# Patient Record
Sex: Female | Born: 1945
Health system: Southern US, Community
[De-identification: ages and names within clinical notes are randomized; demographics above are authoritative.]

## PROBLEM LIST (undated history)

## (undated) DIAGNOSIS — J45909 Unspecified asthma, uncomplicated: Secondary | ICD-10-CM

## (undated) DIAGNOSIS — K219 Gastro-esophageal reflux disease without esophagitis: Secondary | ICD-10-CM

## (undated) DIAGNOSIS — M810 Age-related osteoporosis without current pathological fracture: Secondary | ICD-10-CM

## (undated) DIAGNOSIS — E785 Hyperlipidemia, unspecified: Secondary | ICD-10-CM

## (undated) DIAGNOSIS — I10 Essential (primary) hypertension: Secondary | ICD-10-CM

## (undated) DIAGNOSIS — T7840XA Allergy, unspecified, initial encounter: Secondary | ICD-10-CM

## (undated) DIAGNOSIS — E119 Type 2 diabetes mellitus without complications: Secondary | ICD-10-CM

## (undated) HISTORY — DX: Hyperlipidemia, unspecified: E78.5

## (undated) HISTORY — DX: Unspecified asthma, uncomplicated: J45.909

## (undated) HISTORY — DX: Type 2 diabetes mellitus without complications: E11.9

## (undated) HISTORY — DX: Allergy, unspecified, initial encounter: T78.40XA

## (undated) HISTORY — DX: Essential (primary) hypertension: I10

## (undated) HISTORY — PX: JOINT REPLACEMENT: SHX530

## (undated) HISTORY — DX: Gastro-esophageal reflux disease without esophagitis: K21.9

## (undated) HISTORY — PX: BREAST SURGERY: SHX581

## (undated) HISTORY — DX: Age-related osteoporosis without current pathological fracture: M81.0

---

## 1997-08-09 ENCOUNTER — Inpatient Hospital Stay (HOSPITAL_COMMUNITY): Admission: EM | Admit: 1997-08-09 | Discharge: 1997-08-11 | Payer: Self-pay | Admitting: Cardiology

## 1998-09-27 ENCOUNTER — Inpatient Hospital Stay (HOSPITAL_COMMUNITY)
Admission: RE | Admit: 1998-09-27 | Discharge: 1998-10-09 | Payer: Self-pay | Admitting: Physical Medicine & Rehabilitation

## 1999-11-17 ENCOUNTER — Encounter: Admission: RE | Admit: 1999-11-17 | Discharge: 2000-02-15 | Payer: Self-pay | Admitting: Radiation Oncology

## 2000-08-13 ENCOUNTER — Ambulatory Visit (HOSPITAL_COMMUNITY): Admission: RE | Admit: 2000-08-13 | Discharge: 2000-08-13 | Payer: Self-pay | Admitting: Oncology

## 2001-02-16 ENCOUNTER — Encounter: Payer: Self-pay | Admitting: Oncology

## 2001-02-16 ENCOUNTER — Ambulatory Visit (HOSPITAL_COMMUNITY): Admission: RE | Admit: 2001-02-16 | Discharge: 2001-02-16 | Payer: Self-pay | Admitting: Oncology

## 2005-07-21 ENCOUNTER — Ambulatory Visit: Payer: Self-pay | Admitting: Cardiology

## 2005-07-23 ENCOUNTER — Observation Stay (HOSPITAL_COMMUNITY): Admission: AD | Admit: 2005-07-23 | Discharge: 2005-07-24 | Payer: Self-pay | Admitting: Internal Medicine

## 2005-07-23 ENCOUNTER — Ambulatory Visit: Payer: Self-pay | Admitting: Cardiology

## 2005-08-04 ENCOUNTER — Ambulatory Visit: Payer: Self-pay | Admitting: Cardiology

## 2005-08-14 ENCOUNTER — Ambulatory Visit: Payer: Self-pay | Admitting: Cardiology

## 2005-09-01 ENCOUNTER — Ambulatory Visit: Payer: Self-pay | Admitting: Cardiology

## 2005-10-06 ENCOUNTER — Ambulatory Visit: Payer: Self-pay | Admitting: Cardiology

## 2007-12-13 ENCOUNTER — Ambulatory Visit: Payer: Self-pay | Admitting: Cardiology

## 2008-01-16 ENCOUNTER — Ambulatory Visit: Payer: Self-pay | Admitting: Cardiology

## 2008-01-24 ENCOUNTER — Ambulatory Visit: Payer: Self-pay | Admitting: Cardiology

## 2008-02-06 ENCOUNTER — Ambulatory Visit: Payer: Self-pay | Admitting: Cardiology

## 2010-09-02 NOTE — Assessment & Plan Note (Signed)
[facility]                          [facility]   NAME:[name], [name] ECCLESTON                      MRN:          [id number]  DATE:01/16/2008                            DOB:          1945-07-29    [name] [name] is a very pleasant 65 year old female who I am asked to  evaluate for an abnormal nuclear study and chest tightness.  The patient  has had previous cardiac catheterizations, both in 1999 and 2007.  Both  of these were normal.  The most recent cardiac catheterization was  performed on July 23, 2005.  Cardiolite had been performed, at that  time, that suggested ischemia.  The catheterization revealed no  obstructive coronary disease.  Ejection fraction was 65%.  The left  ventricular end-diastolic pressure was 23 mmHg.  Note, the patient also  had an echocardiogram performed last on August 14, 2005.  She had normal  LV function.  There was mild mitral and tricuspid regurgitation.  There  was moderate pulmonary hypertension with a pulmonary artery systolic  pressure estimated at 50 mmHg.  The patient states that for the past 3  months, she has resumed an exercise program.  She is on an Sport and exercise psychologist for approximately 30 minutes of time and also performs some  weightlifting.  She was there recently and was told that she was having  an arrhythmia after they checked her saturations.  She went to Dr.  Reuel Boom and Dr. Reuel Boom ordered a Myoview.  The nuclear study was  performed on December 13, 2007.  It was interpreted by Dr. Diona Browner and  the conclusion suggested there was a moderate inner apical defect, more  prominent at rest and consistent with soft tissue attenuation rather  than ischemia.  The ejection fraction was 42%, but visually appeared  better.  We were asked to see the patient for this nuclear study.  Note,  the patient does rarely have chest tightness, but also has asthma.  Her  chest tightness increases with inspiration.  She has  mild dyspnea with  more extreme activities, but not with routine activities.  There is no  orthopnea, PND, pedal edema, palpitations, presyncope, or syncope.  She  does not have exertional chest pain.   MEDICATIONS:  1. Aspirin 81 mg p.o. daily.  2. Ventolin inhaler.  3. Symbicort.  4. Fluticasone.  5. Astelin.  6. Loratadine.  7. Amlodipine 10 mg daily.  8. Diovan HCT 160/25 mg tablets 1 p.o. daily.  9. Potassium 2 mEq p.o. b.i.d.  10.Etodolac.  11.Lidoderm patch.  12.Omeprazole.  13.Singulair.  14.Ocuvite.  15.Vitamin C.  16.Vitamin E.  17.Calcium.  18.Multivitamin.   ALLERGIES:  She has an allergy to PENICILLIN, CODEINE, SULFA, FELDENE,  NAPROSYN, ERYTHROMYCIN, and DOXYCYCLINE.  She also has an allergy to  latex.   SOCIAL HISTORY:  She does not smoke at present and it has been 20 years  since she smoked previously.  She does not consume alcohol.   FAMILY HISTORY:  Negative for coronary artery disease at an early age.  She did state her father had coronary disease in his  late 53s.   PAST MEDICAL HISTORY:  Significant for borderline diabetes as well as  hypertension.  She denies any hyperlipidemia.  She does have a history  of asthma and obstructive sleep apnea and uses CPAP.  She has had  previous cancer of the right breast and is status post lumpectomy as  well as chemotherapy and radiation therapy.  She has had a previous  diverticulitis.  She has had 2 previous knee replacements.  She has had  a previous tonsillectomy.  There is no other past medical history noted.   REVIEW OF SYSTEMS:  She denies any headaches, fevers, or chills.  There  is no productive cough or hemoptysis.  There is no dysphagia,  odynophagia, melena, or hematochezia.  There is no dysuria or hematuria.  There is no rash or seizure activity.  There is no orthopnea, PND, or  pedal edema.  Remaining systems are negative.   PHYSICAL EXAMINATION:  VITAL SIGNS:  Today, shows a blood pressure of   128/60 and pulse of 74.  She weighs 347 pounds.  GENERAL:  She is well-developed and morbidly obese.  She is in no acute  distress at present.  Her skin is warm and dry.  She is not  predepressed.  There is no peripheral clubbing.  BACK:  Normal.  HEENT:  Normal eyelids.  NECK:  Supple with a normal upstroke bilaterally and I cannot appreciate  bruits.  There is no jugular venous distention and no thyromegaly.  CHEST:  Clear to auscultation.  Normal expansion.  CARDIOVASCULAR:  Reveals a regular rate and rhythm with a normal S1 and  S2.  There is a 2/6 systolic ejection murmur in the left sternal border.  I cannot appreciate S3 or S4.  ABDOMEN:  Shows no tenderness to palpation.  I cannot appreciate  hepatosplenomegaly.  There are no masses palpated, although exam is  difficult due to her obesity.  EXTREMITIES:  Her femoral pulses are difficult to palpate due to her  obesity.  There are no bruits noted.  Her extremities show no edema that  I could  palpate.  No cords.  She has 2+ posterior tibial pulses  bilaterally.  NEUROLOGIC:  Grossly intact.   I do have an electrocardiogram from December 13, 2007, that showed a sinus  rhythm with no ST changes.   DIAGNOSES:  1. Abnormal nuclear study - the nuclear study is interpreted as soft      tissue attenuation, but no ischemia.  Ejection fraction is reduced,      but visually appeared better.  We will plan to proceed with an      echocardiogram to quantify left ventricular function more fully and      also to evaluate the murmur.  This is particularly in light of her      history of breast cancer and chemotherapy use.  If her      echocardiogram is normal, then she does not require further cardiac      workup.  It should be noted that she had a catheterization in 2007      that showed normal coronary arteries.  2. Hypertension - her blood pressure is adequately controlled on her      present medications.  3. Borderline diabetes  mellitus.  4. History of asthma.  5. Obesity - I encouraged the patient to continue with her exercises      and weight loss.   I will have her return to see one of  our physician's assistants in  approximately 2-4 weeks to review her echocardiogram.  Again, I do not  think she will require further cardiac workup if her echocardiogram is  normal.     Madolyn Frieze. Jens Som, MD, [facility]  Electronically Signed    BSC/MedQ  DD: 01/16/2008  DT: 01/17/2008  Job #: [id number]   cc:   Donzetta Sprung

## 2010-09-02 NOTE — Assessment & Plan Note (Signed)
[facility]                          [facility] NOTE   NAME:[name] [name], [name] [name]                      MRN:          [id number]  DATE:02/06/2008                            DOB:          12/07/1945    PRIMARY CARDIOLOGIST:  Jonelle Sidle, MD.   REASON FOR VISIT:  Scheduled followup.  Please refer to Dr. Lewayne Bunting recent office consultation note of January 16, 2008, for  full details.   At that time, Ms. Elmendorf was referred for evaluation of her recent  abnormal stress test, ordered by Dr. Reuel Boom.  This was apparently for  further evaluation of possible arrhythmia, which the patient  reportedly developed during one of her scheduled [place].  The stress test was reviewed by Dr. Diona Browner.  There was question of a possible moderate anterior apical defect, but  most likely was related to soft tissue attenuation.  Ejection fraction  was calculated at 42%, and there was borderline increase in TID ratio.   Dr. Jens Som reviewed all of this and recommended a followup 2-D  echocardiogram.  He felt that if this were normal, given that she had  had a normal cardiac catheterization in 2007, then no further workup  would be indicated.   The initial echocardiogram, also reviewed by Dr. Diona Browner, showed normal  LVF (55-60%), but with evidence of diastolic dysfunction.  There was  also a question of an apparent small PFO with predominant left-right  shunting.  A followup bubble study or TEE was recommended.  There was  also mild mitral regurgitation.   Given this finding, we ordered a bubble study, just recently completed  and reviewed by Dr. Andee Lineman.  This study, however, showed no evidence of  either a PFO or intrapulmonary shunting.  There was also no evidence of  mitral regurgitation.   All of these results were reviewed with the patient today, in full.  Clinically, her history is suggestive of  atypical chest pain; this is  unpredictable in onset and has no strict correlation with any specific  activity or exertion.  She does, however, have chronic exertional  dyspnea.  However, she also has morbid obesity with a current weight of  nearly 350 pounds.   CURRENT MEDICATIONS:  As previously delineated, during most recent  visit.   PHYSICAL EXAMINATION:  VITAL SIGNS:  Blood pressure 125/73; pulse 70,  regular; and weight 340.6.  GENERAL:  A 65 year old female, morbidly obese, sitting upright, no  distress.  HEENT:  Normocephalic and atraumatic.  NECK:  Palpable.  Carotid pulses without bruits; unable to assess JVD,  secondary to neck girth.  LUNGS:  Clear to auscultation in all fields.  HEART:  Regular rate and rhythm.  No significant murmurs.  ABDOMEN:  Protuberant, intact bowel sounds.  EXTREMITIES:  Minimally palpable dorsalis pedis pulses with trace pedal  edema.  NEURO:  No focal deficit.   IMPRESSION:  1. Atypical chest pain.      a.     Normal coronary angiogram, April 2007.      b.  Normal LVF by recent echocardiogram.  2. Chronic exertional dyspnea.  3. Hypertension.  4. Borderline diabetes mellitus.  5. History of asthma.  6. Morbid obesity.  7. History of palpitations.      a.     Negative event monitor in 2007.  8. History of obstructive sleep apnea.   PLAN:  Following extensive review of the patient's previous diagnostic  tests, including cardiac catheterization in 2007, with Dr. Simona Huh,  no further formal diagnostic testing is suggested at this point in time.  The patient presents with symptoms which are not clearly suggestive of  ischemic heart disease.  Given the relatively recent normal angiogram,  and recent normal echocardiograms, we do not feel that any additional  testing is clearly indicated at this point in time.  She does, however,  need to continue close followup with her primary physician, Dr. Donzetta Sprung, for continued monitoring  and management of her hypertension,  borderline diabetes, and morbid obesity.   Ms. Devargas is also strongly urged to resume her previous exercise  [place] the mall.  I also urged her to try to focus on concomitant  weight loss.   We will be happy to have Ms. Hoak return to our clinic to follow up  with Dr. Simona Huh on an as-needed basis.      Rozell Searing, PA-C  Electronically Signed      Jonelle Sidle, MD  Electronically Signed   GS/MedQ  DD: 02/06/2008  DT: 02/07/2008  Job #: [id number]   cc:   Donzetta Sprung

## 2010-09-05 NOTE — Discharge Summary (Signed)
[name], [name]               ACCOUNT NO.:  [id number]   MEDICAL RECORD NO.:  [id number]          PATIENT TYPE:  INP   LOCATION:  4731                         FACILITY:  [facility]   PHYSICIAN:  Joellyn Rued, P.A. [facility] DATE OF BIRTH:  1945-09-26   DATE OF ADMISSION:  07/23/2005  DATE OF DISCHARGE:  07/24/2005                           DISCHARGE SUMMARY - REFERRING   DISCHARGE DIAGNOSIS:  Noncardiac chest discomfort, status post cardiac  catheterization, which did not show any coronary artery disease.  False-  positive Adenosine-Myoview may be related to her obesity.  History as noted.   PROCEDURES PERFORMED:  Cardiac catheterization on July 22, 2005, by Dr.  Diona Browner.   SUMMARY OF HISTORY:  [name] is a 65 year old, African-American female,  who presented to [facility] on July 21, 2005, with sharp chest  pressure that began around midnight radiating to the left side into her  back.  Symptoms would last two to three minutes and, after several episodes  with sharp jabbing discomfort, she was referred to the ER by her primary  care physician.  The discomfort can occur intermittently with at rest and  with exertion and has been going on for several weeks.  She also describes  headaches, occasional dyspnea on exertion, diaphoresis.  Prior cardiac  catheterization in 1999 did not show any significant coronary disease.   PAST MEDICAL HISTORY:  1.  Morbid obesity.  2.  Diabetes.  3.  Hypertension.  4.  Dyslipidemia.  5.  Right breast adenocarcinoma status post chemotherapy.  6.  DVT.  7.  Bilateral carpal tunnel syndrome.  8.  Obstructive sleep apnea.  9.  Neuropathy.  10. Chronic low back pain.  11. Colitis.  12. Asthma.   LABORATORY DATA:  At [facility], admission weight was 328.2.  Post  catheterization, H&H were 11.8 and 36.2, normal indices, platelets 362, WBC  7.7.  Sodium 137, potassium 3.5, BUN 8, creatinine 0.8.   HOSPITAL COURSE:  [name] [name] was transferred  to [facility] after  she underwent Adenosine-Myoview at [facility].  This showed an EF of  60% and anteroseptal and inferior partial reversible defects, which could  represent ischemia.  It was felt that she should undergo cardiac  catheterization.   On July 23, 2005, she underwent cardiac catheterization by Dr. Diona Browner.  She did not have any coronary artery disease, the EF was 65%.  Post sheath  removal and bedrest, the patient was ambulating without difficulty.  Findings were discussed with Dr. Andee Lineman, and it was felt that her discomfort  was not cardiac related.  Given her multiple risk factors, she was kept  overnight for further observation, the catheterization site was stable.  On  July 24, 2005, she was ambulating without difficulty and it was felt that  she could be discharged home.   DISPOSITION ON DISCHARGE:  She was asked to maintain low salt, fat,  cholesterol, ADA diet.  Her activity and wound care per supplemental  discharge instruction sheet post catheterization.  She was asked to bring  all medicines to all appointments.  Her medications remained essentially  unchanged from prior to admission:  1.  Albuterol 2 puffs q.4h.  2.  Advair 500/50 1 puff b.i.d.  3.  DuoNeb 4 times a day.  4.  Nasonex as previously.  5.  Astelin as previously.  6.  Singulair 10 mg daily.  7.  Loratadine 10 mg daily.  8.  Norvasc 10 mg daily.  9.  Potassium 20 mEq daily.  10. HCTZ 25 mg daily.  11. Lasix 40 mg daily.  12. Lodine daily.  13. Nexium 40 mg daily.  14. Arimidex 1.0 mg daily.  15. Multivitamin daily with Vitamin C, E, calcium, and zinc as previously.  16. Tramadol 50 mg p.r.n.  17. Senna-C 2 tabs daily.  18. Baby aspirin 81 mg daily.   On Monday, she was asked to call Dr. Margarita Mail office and arrange a groin  check with Arnette Felts for next week and to also call Dr. Reuel Boom at his  office to arrange one to two week followup.      Joellyn Rued, P.A.  [facility]     EW/MEDQ  D:  07/24/2005  T:  07/24/2005  Job:  [id number]   cc:   Donzetta Sprung  Fax: [phone]   Learta Codding, M.D. [facility]  [address]  [address]  [place]  [place] [address]

## 2010-09-05 NOTE — Cardiovascular Report (Signed)
NAMEDEEANNA, BEIGHTOL NO.:  [id number]   MEDICAL RECORD NO.:  [id number]          PATIENT TYPE:  INP   LOCATION:  4731                         FACILITY:  [facility]   PHYSICIAN:  Jonelle Sidle, M.D. LHCDATE OF BIRTH:  1945/05/02   DATE OF PROCEDURE:  07/23/2005  DATE OF DISCHARGE:                              CARDIAC CATHETERIZATION   REQUESTING CARDIOLOGIST:  Dr. Lewayne Bunting   PRIMARY CARE PHYSICIAN:  Dr. Fara Chute   INDICATION:  Ms. Gordon is a 65 year old morbidly obese woman with type 2  diabetes mellitus, hypertension, and hyperlipidemia.  She was recently  admitted to [facility] with a description of sharp,  intermittent chest discomfort and shortness of breath.  She underwent an  adenosine Cardiolite study which was electrocardiographically non-diagnostic  of ischemia and revealed anteroseptal and inferior partially reversible  defects.  It was felt that this could have been artifactual; however,  ischemia could not be clearly excluded.  The ejection fraction was noted to  be 60%.  She is now referred for a diagnostic coronary angiography including  a right heart catheterization for the assessment of potential pulmonary  hypertension and definition of the coronary anatomy.  The risks and benefits  of the procedure were explained to the patient in advance and informed  consent was obtained.   PROCEDURE PERFORMED:  1.  Left heart catheterization.  2.  Right heart catheterization.  3.  Selective coronary angiography.  4.  Left ventriculography.   ACCESS AND EQUIPMENT:  The area about the right femoral artery and vein was  anesthetized with 1% lidocaine.  A 6-French sheath was placed in the right  femoral artery via modified the Seldinger technique followed by a 7-French  sheath in the right femoral vein via the modified Seldinger technique.  Standard preformed 6-French JL4 and JR4 catheters were used for selective  coronary angiography  and an angled pigtail catheter was used for left heart  catheterization and left ventriculography.  A standard 7-French balloon-  tipped flow-directed catheter was used for right heart catheterization.  All  exchanges were made over a wire with the exception of the right heart  catheter.  A total of 110 mL of Omnipaque were used.  The patient tolerated  the procedure well without immediate complications.  She did complain of  back discomfort while lying on the catheterization table during the  procedure.  She received a total of 4 mg Versed, 4 mg morphine, and 50 mcg  fentanyl during the procedure.   HEMODYNAMIC RESULTS:  Right atrial mean of 17.  Right ventricle 37/17.  Pulmonary artery 33/21 with a mean of 27.  Pulmonary capillary wedge  pressure mean of 24.  Left ventricle 163/23.  Aorta 162/87.  Cardiac output  5.6 by the Fick method.  Cardiac index 2.3 by the Fick method.  Arterial  saturation is 93% on room air.  Pulmonary artery saturation is 69% on room  air.   ANGIOGRAPHIC FINDINGS:  1.  There is a very short left main coronary artery with essentially nearly      separate ostia of  the left anterior descending and circumflex coronary      arteries.  2.  The left anterior descending is a fairly large-caliber vessel that      extends to the apex where it bifurcates.  There are three diagonal      branches the first of which bifurcates.  There are minor luminal      irregularities, although no flow-limiting stenoses are noted.  No      significant bridging is noted.  3.  The circumflex coronary artery is medium in caliber.  There are two      obtuse marginal branches noted.  No significant flow-limiting coronary      atherosclerosis is noted.  4.  The right coronary artery is a large, dominant vessel with a large      posterior descending branch and three small right ventricular marginal      branches.  There is also a fairly well-developed posterolateral system.      There are  minor luminal irregularities without flow-limiting stenoses.   Left ventriculography was performed in the RAO projection, revealed ejection  fraction of approximately 65% without wall motion abnormalities and no  significant mitral regurgitation.   DIAGNOSES:  1.  No obstructive coronary artery disease noted within the major epicardial      vessels.  2.  Left ventricular ejection fraction approximately 65% with no focal wall      motion abnormalities and left ventricle end-diastolic pressure of 23      mmHg.  3.  Essentially upper normal pulmonary artery systolic pressure measured at      33, pulmonary capillary wedge pressure mean of 24, cardiac output of      5.6, and cardiac index of 2.3.  Arterial saturation is 93% on room air.   DISCUSSION:  I reviewed the results with the patient and with Dr. Andee Lineman by  phone.  Chest pain would seem to be most likely non-cardiac in etiology and  this study would suggest that the potential perfusion abnormalities noted on  Cardiolite study were artifactual.  Would anticipate risk factor  modification at this point.  The patient will be observed overnight with  plans for Dr. Andee Lineman to follow up with her.           ______________________________  Jonelle Sidle, M.D. [facility]     SGM/MEDQ  D:  07/23/2005  T:  07/24/2005  Job:  [id number]   cc:   Learta Codding, M.D. [facility]  [address]  [address]  [place]  [place] [address]   Fara Chute  Fax: [phone]

## 2012-01-04 ENCOUNTER — Encounter: Payer: Self-pay | Admitting: Hematology and Oncology

## 2012-01-04 DIAGNOSIS — J4489 Other specified chronic obstructive pulmonary disease: Secondary | ICD-10-CM

## 2012-01-04 DIAGNOSIS — Z853 Personal history of malignant neoplasm of breast: Secondary | ICD-10-CM

## 2012-01-04 DIAGNOSIS — R920 Mammographic microcalcification found on diagnostic imaging of breast: Secondary | ICD-10-CM

## 2012-01-04 DIAGNOSIS — I10 Essential (primary) hypertension: Secondary | ICD-10-CM

## 2012-01-04 DIAGNOSIS — J449 Chronic obstructive pulmonary disease, unspecified: Secondary | ICD-10-CM

## 2012-01-13 ENCOUNTER — Encounter: Payer: Self-pay | Admitting: Hematology and Oncology

## 2012-01-13 DIAGNOSIS — J449 Chronic obstructive pulmonary disease, unspecified: Secondary | ICD-10-CM

## 2012-01-13 DIAGNOSIS — J209 Acute bronchitis, unspecified: Secondary | ICD-10-CM

## 2014-09-06 ENCOUNTER — Other Ambulatory Visit (HOSPITAL_COMMUNITY): Payer: Self-pay | Admitting: Family Medicine

## 2014-09-06 ENCOUNTER — Ambulatory Visit (HOSPITAL_COMMUNITY)
Admission: RE | Admit: 2014-09-06 | Discharge: 2014-09-06 | Disposition: A | Discharge status: Home / Self Care | Payer: Medicare Other | Source: Ambulatory Visit | Attending: Family Medicine | Admitting: Family Medicine

## 2014-09-06 DIAGNOSIS — M79662 Pain in left lower leg: Secondary | ICD-10-CM

## 2014-09-06 DIAGNOSIS — M7989 Other specified soft tissue disorders: Secondary | ICD-10-CM | POA: Diagnosis not present

## 2015-04-24 DIAGNOSIS — G4733 Obstructive sleep apnea (adult) (pediatric): Secondary | ICD-10-CM | POA: Diagnosis not present

## 2015-04-24 DIAGNOSIS — E782 Mixed hyperlipidemia: Secondary | ICD-10-CM | POA: Diagnosis not present

## 2015-04-24 DIAGNOSIS — J301 Allergic rhinitis due to pollen: Secondary | ICD-10-CM | POA: Diagnosis not present

## 2015-04-24 DIAGNOSIS — R69 Illness, unspecified: Secondary | ICD-10-CM | POA: Diagnosis not present

## 2015-04-24 DIAGNOSIS — I10 Essential (primary) hypertension: Secondary | ICD-10-CM | POA: Diagnosis not present

## 2015-04-24 DIAGNOSIS — G6289 Other specified polyneuropathies: Secondary | ICD-10-CM | POA: Diagnosis not present

## 2015-04-24 DIAGNOSIS — E1165 Type 2 diabetes mellitus with hyperglycemia: Secondary | ICD-10-CM | POA: Diagnosis not present

## 2015-04-24 DIAGNOSIS — Z1389 Encounter for screening for other disorder: Secondary | ICD-10-CM | POA: Diagnosis not present

## 2015-06-03 DIAGNOSIS — R0902 Hypoxemia: Secondary | ICD-10-CM | POA: Diagnosis not present

## 2015-06-03 DIAGNOSIS — G4733 Obstructive sleep apnea (adult) (pediatric): Secondary | ICD-10-CM | POA: Diagnosis not present

## 2015-06-10 DIAGNOSIS — J4541 Moderate persistent asthma with (acute) exacerbation: Secondary | ICD-10-CM | POA: Diagnosis not present

## 2015-07-01 DIAGNOSIS — J4541 Moderate persistent asthma with (acute) exacerbation: Secondary | ICD-10-CM | POA: Diagnosis not present

## 2015-07-01 DIAGNOSIS — J40 Bronchitis, not specified as acute or chronic: Secondary | ICD-10-CM | POA: Diagnosis not present

## 2015-07-03 DIAGNOSIS — R0602 Shortness of breath: Secondary | ICD-10-CM | POA: Diagnosis not present

## 2015-07-03 DIAGNOSIS — R208 Other disturbances of skin sensation: Secondary | ICD-10-CM | POA: Diagnosis not present

## 2015-07-03 DIAGNOSIS — J1089 Influenza due to other identified influenza virus with other manifestations: Secondary | ICD-10-CM | POA: Diagnosis not present

## 2015-07-03 DIAGNOSIS — J101 Influenza due to other identified influenza virus with other respiratory manifestations: Secondary | ICD-10-CM | POA: Diagnosis not present

## 2015-07-03 DIAGNOSIS — R079 Chest pain, unspecified: Secondary | ICD-10-CM | POA: Diagnosis not present

## 2015-07-03 DIAGNOSIS — R69 Illness, unspecified: Secondary | ICD-10-CM | POA: Diagnosis not present

## 2015-07-03 DIAGNOSIS — I5032 Chronic diastolic (congestive) heart failure: Secondary | ICD-10-CM | POA: Diagnosis not present

## 2015-07-03 DIAGNOSIS — J45901 Unspecified asthma with (acute) exacerbation: Secondary | ICD-10-CM | POA: Diagnosis not present

## 2015-07-03 DIAGNOSIS — E876 Hypokalemia: Secondary | ICD-10-CM | POA: Diagnosis not present

## 2015-07-03 DIAGNOSIS — I10 Essential (primary) hypertension: Secondary | ICD-10-CM | POA: Diagnosis not present

## 2015-07-03 DIAGNOSIS — J440 Chronic obstructive pulmonary disease with acute lower respiratory infection: Secondary | ICD-10-CM | POA: Diagnosis not present

## 2015-07-03 DIAGNOSIS — J309 Allergic rhinitis, unspecified: Secondary | ICD-10-CM | POA: Diagnosis not present

## 2015-07-03 DIAGNOSIS — Z6841 Body Mass Index (BMI) 40.0 and over, adult: Secondary | ICD-10-CM | POA: Diagnosis not present

## 2015-07-11 DIAGNOSIS — G4733 Obstructive sleep apnea (adult) (pediatric): Secondary | ICD-10-CM | POA: Diagnosis not present

## 2015-07-26 DIAGNOSIS — I248 Other forms of acute ischemic heart disease: Secondary | ICD-10-CM | POA: Diagnosis not present

## 2015-07-26 DIAGNOSIS — E1140 Type 2 diabetes mellitus with diabetic neuropathy, unspecified: Secondary | ICD-10-CM | POA: Diagnosis not present

## 2015-08-06 DIAGNOSIS — K219 Gastro-esophageal reflux disease without esophagitis: Secondary | ICD-10-CM | POA: Diagnosis not present

## 2015-08-06 DIAGNOSIS — E1165 Type 2 diabetes mellitus with hyperglycemia: Secondary | ICD-10-CM | POA: Diagnosis not present

## 2015-08-06 DIAGNOSIS — I10 Essential (primary) hypertension: Secondary | ICD-10-CM | POA: Diagnosis not present

## 2015-08-06 DIAGNOSIS — E782 Mixed hyperlipidemia: Secondary | ICD-10-CM | POA: Diagnosis not present

## 2015-08-06 DIAGNOSIS — E876 Hypokalemia: Secondary | ICD-10-CM | POA: Diagnosis not present

## 2015-08-09 DIAGNOSIS — R69 Illness, unspecified: Secondary | ICD-10-CM | POA: Diagnosis not present

## 2015-08-09 DIAGNOSIS — J4541 Moderate persistent asthma with (acute) exacerbation: Secondary | ICD-10-CM | POA: Diagnosis not present

## 2015-08-09 DIAGNOSIS — G4733 Obstructive sleep apnea (adult) (pediatric): Secondary | ICD-10-CM | POA: Diagnosis not present

## 2015-08-09 DIAGNOSIS — G6289 Other specified polyneuropathies: Secondary | ICD-10-CM | POA: Diagnosis not present

## 2015-08-09 DIAGNOSIS — E1165 Type 2 diabetes mellitus with hyperglycemia: Secondary | ICD-10-CM | POA: Diagnosis not present

## 2015-08-09 DIAGNOSIS — E782 Mixed hyperlipidemia: Secondary | ICD-10-CM | POA: Diagnosis not present

## 2015-08-11 DIAGNOSIS — G4733 Obstructive sleep apnea (adult) (pediatric): Secondary | ICD-10-CM | POA: Diagnosis not present

## 2015-08-12 DIAGNOSIS — Z1231 Encounter for screening mammogram for malignant neoplasm of breast: Secondary | ICD-10-CM | POA: Diagnosis not present

## 2015-08-12 DIAGNOSIS — R921 Mammographic calcification found on diagnostic imaging of breast: Secondary | ICD-10-CM | POA: Diagnosis not present

## 2015-08-25 DIAGNOSIS — E1140 Type 2 diabetes mellitus with diabetic neuropathy, unspecified: Secondary | ICD-10-CM | POA: Diagnosis not present

## 2015-08-25 DIAGNOSIS — I248 Other forms of acute ischemic heart disease: Secondary | ICD-10-CM | POA: Diagnosis not present

## 2015-08-28 DIAGNOSIS — R921 Mammographic calcification found on diagnostic imaging of breast: Secondary | ICD-10-CM | POA: Diagnosis not present

## 2015-09-10 DIAGNOSIS — G4733 Obstructive sleep apnea (adult) (pediatric): Secondary | ICD-10-CM | POA: Diagnosis not present

## 2015-09-13 DIAGNOSIS — C50919 Malignant neoplasm of unspecified site of unspecified female breast: Secondary | ICD-10-CM | POA: Diagnosis not present

## 2015-09-25 DIAGNOSIS — E1140 Type 2 diabetes mellitus with diabetic neuropathy, unspecified: Secondary | ICD-10-CM | POA: Diagnosis not present

## 2015-09-25 DIAGNOSIS — I248 Other forms of acute ischemic heart disease: Secondary | ICD-10-CM | POA: Diagnosis not present

## 2015-10-11 DIAGNOSIS — G4733 Obstructive sleep apnea (adult) (pediatric): Secondary | ICD-10-CM | POA: Diagnosis not present

## 2015-10-25 DIAGNOSIS — I248 Other forms of acute ischemic heart disease: Secondary | ICD-10-CM | POA: Diagnosis not present

## 2015-10-25 DIAGNOSIS — E1140 Type 2 diabetes mellitus with diabetic neuropathy, unspecified: Secondary | ICD-10-CM | POA: Diagnosis not present

## 2015-10-29 DIAGNOSIS — R69 Illness, unspecified: Secondary | ICD-10-CM | POA: Diagnosis not present

## 2015-11-07 DIAGNOSIS — Z6841 Body Mass Index (BMI) 40.0 and over, adult: Secondary | ICD-10-CM | POA: Diagnosis not present

## 2015-11-07 DIAGNOSIS — E782 Mixed hyperlipidemia: Secondary | ICD-10-CM | POA: Diagnosis not present

## 2015-11-07 DIAGNOSIS — E1165 Type 2 diabetes mellitus with hyperglycemia: Secondary | ICD-10-CM | POA: Diagnosis not present

## 2015-11-07 DIAGNOSIS — G6289 Other specified polyneuropathies: Secondary | ICD-10-CM | POA: Diagnosis not present

## 2015-11-07 DIAGNOSIS — R69 Illness, unspecified: Secondary | ICD-10-CM | POA: Diagnosis not present

## 2015-11-07 DIAGNOSIS — G4733 Obstructive sleep apnea (adult) (pediatric): Secondary | ICD-10-CM | POA: Diagnosis not present

## 2015-11-10 DIAGNOSIS — G4733 Obstructive sleep apnea (adult) (pediatric): Secondary | ICD-10-CM | POA: Diagnosis not present

## 2015-11-12 DIAGNOSIS — I10 Essential (primary) hypertension: Secondary | ICD-10-CM | POA: Diagnosis not present

## 2015-11-12 DIAGNOSIS — M25561 Pain in right knee: Secondary | ICD-10-CM | POA: Diagnosis not present

## 2015-11-12 DIAGNOSIS — M25562 Pain in left knee: Secondary | ICD-10-CM | POA: Diagnosis not present

## 2015-11-12 DIAGNOSIS — Z96653 Presence of artificial knee joint, bilateral: Secondary | ICD-10-CM | POA: Diagnosis not present

## 2015-11-12 DIAGNOSIS — Z87891 Personal history of nicotine dependence: Secondary | ICD-10-CM | POA: Diagnosis not present

## 2015-11-12 DIAGNOSIS — Z79899 Other long term (current) drug therapy: Secondary | ICD-10-CM | POA: Diagnosis not present

## 2015-11-12 DIAGNOSIS — Z7982 Long term (current) use of aspirin: Secondary | ICD-10-CM | POA: Diagnosis not present

## 2015-11-12 DIAGNOSIS — Z880 Allergy status to penicillin: Secondary | ICD-10-CM | POA: Diagnosis not present

## 2015-11-12 DIAGNOSIS — Z881 Allergy status to other antibiotic agents status: Secondary | ICD-10-CM | POA: Diagnosis not present

## 2015-11-12 DIAGNOSIS — M6158 Other ossification of muscle, other site: Secondary | ICD-10-CM | POA: Diagnosis not present

## 2015-11-18 DIAGNOSIS — Z1211 Encounter for screening for malignant neoplasm of colon: Secondary | ICD-10-CM | POA: Diagnosis not present

## 2015-11-18 DIAGNOSIS — E669 Obesity, unspecified: Secondary | ICD-10-CM | POA: Diagnosis not present

## 2015-11-18 DIAGNOSIS — J45909 Unspecified asthma, uncomplicated: Secondary | ICD-10-CM | POA: Diagnosis not present

## 2015-11-18 DIAGNOSIS — G4730 Sleep apnea, unspecified: Secondary | ICD-10-CM | POA: Diagnosis not present

## 2015-11-30 DIAGNOSIS — G4733 Obstructive sleep apnea (adult) (pediatric): Secondary | ICD-10-CM | POA: Diagnosis not present

## 2015-12-04 DIAGNOSIS — R69 Illness, unspecified: Secondary | ICD-10-CM | POA: Diagnosis not present

## 2015-12-11 DIAGNOSIS — G4733 Obstructive sleep apnea (adult) (pediatric): Secondary | ICD-10-CM | POA: Diagnosis not present

## 2015-12-12 DIAGNOSIS — Z6841 Body Mass Index (BMI) 40.0 and over, adult: Secondary | ICD-10-CM | POA: Diagnosis not present

## 2015-12-12 DIAGNOSIS — M810 Age-related osteoporosis without current pathological fracture: Secondary | ICD-10-CM | POA: Diagnosis not present

## 2015-12-12 DIAGNOSIS — I10 Essential (primary) hypertension: Secondary | ICD-10-CM | POA: Diagnosis not present

## 2015-12-12 DIAGNOSIS — R69 Illness, unspecified: Secondary | ICD-10-CM | POA: Diagnosis not present

## 2015-12-12 DIAGNOSIS — K219 Gastro-esophageal reflux disease without esophagitis: Secondary | ICD-10-CM | POA: Diagnosis not present

## 2015-12-12 DIAGNOSIS — I4891 Unspecified atrial fibrillation: Secondary | ICD-10-CM | POA: Diagnosis not present

## 2015-12-12 DIAGNOSIS — Z0000 Encounter for general adult medical examination without abnormal findings: Secondary | ICD-10-CM | POA: Diagnosis not present

## 2015-12-12 DIAGNOSIS — J45909 Unspecified asthma, uncomplicated: Secondary | ICD-10-CM | POA: Diagnosis not present

## 2015-12-12 DIAGNOSIS — I87332 Chronic venous hypertension (idiopathic) with ulcer and inflammation of left lower extremity: Secondary | ICD-10-CM | POA: Diagnosis not present

## 2015-12-19 DIAGNOSIS — Z6841 Body Mass Index (BMI) 40.0 and over, adult: Secondary | ICD-10-CM | POA: Diagnosis not present

## 2015-12-22 DIAGNOSIS — I878 Other specified disorders of veins: Secondary | ICD-10-CM | POA: Diagnosis not present

## 2015-12-22 DIAGNOSIS — Z96653 Presence of artificial knee joint, bilateral: Secondary | ICD-10-CM | POA: Diagnosis not present

## 2015-12-22 DIAGNOSIS — I83028 Varicose veins of left lower extremity with ulcer other part of lower leg: Secondary | ICD-10-CM | POA: Diagnosis not present

## 2015-12-22 DIAGNOSIS — Z853 Personal history of malignant neoplasm of breast: Secondary | ICD-10-CM | POA: Diagnosis not present

## 2015-12-22 DIAGNOSIS — Z7982 Long term (current) use of aspirin: Secondary | ICD-10-CM | POA: Diagnosis not present

## 2015-12-22 DIAGNOSIS — Z7951 Long term (current) use of inhaled steroids: Secondary | ICD-10-CM | POA: Diagnosis not present

## 2015-12-22 DIAGNOSIS — Z79899 Other long term (current) drug therapy: Secondary | ICD-10-CM | POA: Diagnosis not present

## 2015-12-22 DIAGNOSIS — J45909 Unspecified asthma, uncomplicated: Secondary | ICD-10-CM | POA: Diagnosis not present

## 2015-12-22 DIAGNOSIS — L97829 Non-pressure chronic ulcer of other part of left lower leg with unspecified severity: Secondary | ICD-10-CM | POA: Diagnosis not present

## 2015-12-22 DIAGNOSIS — I10 Essential (primary) hypertension: Secondary | ICD-10-CM | POA: Diagnosis not present

## 2016-01-03 ENCOUNTER — Encounter: Payer: Self-pay | Admitting: Nutrition

## 2016-01-03 ENCOUNTER — Encounter: Payer: Medicare HMO | Attending: Family Medicine | Admitting: Nutrition

## 2016-01-03 DIAGNOSIS — Z713 Dietary counseling and surveillance: Secondary | ICD-10-CM | POA: Insufficient documentation

## 2016-01-03 DIAGNOSIS — E785 Hyperlipidemia, unspecified: Secondary | ICD-10-CM | POA: Insufficient documentation

## 2016-01-03 DIAGNOSIS — E119 Type 2 diabetes mellitus without complications: Secondary | ICD-10-CM | POA: Insufficient documentation

## 2016-01-03 DIAGNOSIS — I10 Essential (primary) hypertension: Secondary | ICD-10-CM | POA: Insufficient documentation

## 2016-01-03 NOTE — Patient Instructions (Signed)
Goals 1. Follow My Plate 2. Cut out soda, juice and tea 3 Drink water only 4. Exercise with water aerobics three times per week. Increase low carb vegetables Do not skip meals. Increase fiber rich foods. Lose 1-2 lbs per week

## 2016-01-03 NOTE — Progress Notes (Signed)
  Medical Nutrition Therapy:  Appt start time: 0930 end time:  1030.  Assessment:  Primary concerns today: Diabetes Type 2. LIves with her son.  PMH:  DM, HTN, Hyperlipiemia, Morbid obesity. TG 178 mg/dl. A1C ?Marland Kitchen Not on any meds for DM.  She does the cooking and shopping in the home.  Most meals are baked, broiled and some frying.  All meals are eaten home. Eats three meals per day.  Desires to lose weight. Has been put on Phenteramine and has lost 15 lbs. Was doing water aerobics but had to stop due to chloride. Will try to get back to do it for exercises.  Not testing blood sugars.     Sees Dr. Gar Ponto for PCP. Diet improving but needs more fresh fruits, vegetables and whole grains. Needs increased physical activity for needed weight loss. Needs to cut out sodas and drink only water.    Preferred Learning Style:    No preference indicated    Learning Readiness:   Not ready  Ready  Change in progress   MEDICATIONS: See list   DIETARY INTAKE:  24-hr recall:  B ( AM): [place] yogurt, OR Cream of wheat or oatmeal with juice, green tea  Snk ( AM): none  L ( PM): Chef salad with [place] or [place], water Snk ( PM): none D ( PM):  Mixed greens, cube steak and rice, 7 up,  Snk ( PM): none Beverages: Water, soda and some green tea  Usual physical activity: ADL: limited mobilty  Estimated energy needs: 1200 calories 135 g carbohydrates  90 g protein 33 g fat  Progress Towards Goal(s):  In progress.   Nutritional Diagnosis:  Damar-3.3 Overweight/obesity As related to excessve calorie intake.  As evidenced by BMI 64.   Intervention:  Nutrition and Diabetes education provided on My Plate, CHO counting, meal planning, portion sizes, timing of meals, avoiding snacks between meals unless having a low blood sugar, target ranges for A1C and blood sugars, signs/symptoms and treatment of hyper/hypoglycemia, monitoring blood sugars, taking medications as prescribed, benefits of  exercising 30 minutes per day and prevention of complications of DM. Low Cholesterol High Fiber Low fat Low Sodium Diet.   .Goals 1. Follow My Plate 2. Cut out soda, juice and tea 3 Drink water only 4. Exercise with water aerobics three times per week. Increase low carb vegetables Do not skip meals. Increase fiber rich foods. Lose 1-2 lbs per week  Teaching Method Utilized:  Visual Auditory Hands on  Handouts given during visit include:  The Plate Method  Meal Plan Card  Diabetes Instructions  Barriers to learning/adherence to lifestyle change: none  Demonstrated degree of understanding via:  Teach Back   Monitoring/Evaluation:  Dietary intake, exercise, meal planning, and body weight in 1 month(s).

## 2016-01-11 DIAGNOSIS — G4733 Obstructive sleep apnea (adult) (pediatric): Secondary | ICD-10-CM | POA: Diagnosis not present

## 2016-01-21 DIAGNOSIS — Z6841 Body Mass Index (BMI) 40.0 and over, adult: Secondary | ICD-10-CM | POA: Diagnosis not present

## 2016-01-23 ENCOUNTER — Other Ambulatory Visit: Payer: Self-pay | Admitting: Nutrition

## 2016-01-23 ENCOUNTER — Encounter: Payer: Self-pay | Admitting: Nutrition

## 2016-01-24 DIAGNOSIS — Z23 Encounter for immunization: Secondary | ICD-10-CM | POA: Diagnosis not present

## 2016-01-25 DIAGNOSIS — I248 Other forms of acute ischemic heart disease: Secondary | ICD-10-CM | POA: Diagnosis not present

## 2016-01-25 DIAGNOSIS — E1140 Type 2 diabetes mellitus with diabetic neuropathy, unspecified: Secondary | ICD-10-CM | POA: Diagnosis not present

## 2016-02-03 ENCOUNTER — Encounter: Payer: Medicare HMO | Attending: Family Medicine | Admitting: Nutrition

## 2016-02-03 ENCOUNTER — Encounter: Payer: Self-pay | Admitting: Nutrition

## 2016-02-03 DIAGNOSIS — E119 Type 2 diabetes mellitus without complications: Secondary | ICD-10-CM | POA: Insufficient documentation

## 2016-02-03 DIAGNOSIS — Z713 Dietary counseling and surveillance: Secondary | ICD-10-CM | POA: Insufficient documentation

## 2016-02-03 DIAGNOSIS — I10 Essential (primary) hypertension: Secondary | ICD-10-CM | POA: Insufficient documentation

## 2016-02-03 DIAGNOSIS — E785 Hyperlipidemia, unspecified: Secondary | ICD-10-CM | POA: Diagnosis not present

## 2016-02-03 NOTE — Patient Instructions (Addendum)
Goals 1. Eat three meals per day on times 2. Cut out sodas and tea 3. Keep drinking 4 bottles per day 4. Increase protein as tolerated with meals 5. Do not skip meals Lose 1-2 lbs per week.  Get A1C checked. Start water aerobics when you can.

## 2016-02-03 NOTE — Progress Notes (Signed)
  Medical Nutrition Therapy:  Appt start time: 1000 end time:  1030.  Assessment:  Primary concerns today: Diabetes Type 2 and morbid obesity.  Lost 5 lbs since last visit. Was 363 and now 358 lbs.  Change made: Cut down on portions and fried foods. Using a sectional plate to portion foods out at meal time.  Trying to eat earlier in evening. Not checking blood sugars. Can't do milk or PB.    Going Friday for blood work for Dr. Quillian Quince.   Still caring for her Mom and her son with mental health issues and has to take them to a lot of appointments..     Making changes slowly with success of weight loss. In the process of trying to get dentures.    If she can get her mom home with aide, she will start going to [facility] for water aerobics.   Preferred Learning Style:    No preference indicated    Learning Readiness:     Change in progress   MEDICATIONS: See list   DIETARY INTAKE:  24-hr recall:  B ( AM): Fruit bar nutrigrain or Yogurt, water Snk ( AM): none  L ( PM): Leftovers: grilled pork chop, cauliflower medley or asparagus and mac/cheese, water Snk ( PM): none D ( PM): Small bowl of chicken stirfy, noodles 1 cup, water Snk ( PM): none Beverages: Water,   Usual physical activity: ADL: limited mobilty  Estimated energy needs: 1200 calories 135 g carbohydrates  90 g protein 33 g fat  Progress Towards Goal(s):  In progress.   Nutritional Diagnosis:  [place]-3.3 Overweight/obesity As related to excessve calorie intake.  As evidenced by BMI 64.   Intervention:  Nutrition and Diabetes education provided on My Plate, CHO counting, meal planning, portion sizes, timing of meals, avoiding snacks between meals unless having a low blood sugar, target ranges for A1C and blood sugars, signs/symptoms and treatment of hyper/hypoglycemia, monitoring blood sugars, taking medications as prescribed, benefits of exercising 30 minutes per day and prevention of complications of DM. Low Cholesterol High  Fiber Low fat Low Sodium Diet.   Goals 1. Eat three meals per day on times 2. Cut out sodas and tea 3. Keep drinking 4 bottles per day 4. Increase protein as tolerated with meals 5. Do not skip meals Lose 1-2 lbs per week.  Get A1C checked. Start water aerobics when you can.  Teaching Method Utilized:  Visual Auditory Hands on  Handouts given during visit include:  The Plate Method  Meal Plan Card  Diabetes Instructions  Barriers to learning/adherence to lifestyle change: none  Demonstrated degree of understanding via:  Teach Back   Monitoring/Evaluation:  Dietary intake, exercise, meal planning, and body weight in 1 month(s).

## 2016-02-07 DIAGNOSIS — E1165 Type 2 diabetes mellitus with hyperglycemia: Secondary | ICD-10-CM | POA: Diagnosis not present

## 2016-02-07 DIAGNOSIS — I10 Essential (primary) hypertension: Secondary | ICD-10-CM | POA: Diagnosis not present

## 2016-02-07 DIAGNOSIS — E876 Hypokalemia: Secondary | ICD-10-CM | POA: Diagnosis not present

## 2016-02-07 DIAGNOSIS — E782 Mixed hyperlipidemia: Secondary | ICD-10-CM | POA: Diagnosis not present

## 2016-02-10 DIAGNOSIS — G4733 Obstructive sleep apnea (adult) (pediatric): Secondary | ICD-10-CM | POA: Diagnosis not present

## 2016-02-11 DIAGNOSIS — E876 Hypokalemia: Secondary | ICD-10-CM | POA: Diagnosis not present

## 2016-02-11 DIAGNOSIS — E782 Mixed hyperlipidemia: Secondary | ICD-10-CM | POA: Diagnosis not present

## 2016-02-11 DIAGNOSIS — I10 Essential (primary) hypertension: Secondary | ICD-10-CM | POA: Diagnosis not present

## 2016-02-11 DIAGNOSIS — M545 Low back pain: Secondary | ICD-10-CM | POA: Diagnosis not present

## 2016-02-11 DIAGNOSIS — J4541 Moderate persistent asthma with (acute) exacerbation: Secondary | ICD-10-CM | POA: Diagnosis not present

## 2016-02-11 DIAGNOSIS — R69 Illness, unspecified: Secondary | ICD-10-CM | POA: Diagnosis not present

## 2016-02-11 DIAGNOSIS — M5136 Other intervertebral disc degeneration, lumbar region: Secondary | ICD-10-CM | POA: Diagnosis not present

## 2016-02-25 DIAGNOSIS — E876 Hypokalemia: Secondary | ICD-10-CM | POA: Diagnosis not present

## 2016-02-25 DIAGNOSIS — E1165 Type 2 diabetes mellitus with hyperglycemia: Secondary | ICD-10-CM | POA: Diagnosis not present

## 2016-03-05 ENCOUNTER — Encounter: Payer: Medicare HMO | Attending: Family Medicine | Admitting: Nutrition

## 2016-03-05 DIAGNOSIS — E7800 Pure hypercholesterolemia, unspecified: Secondary | ICD-10-CM | POA: Diagnosis not present

## 2016-03-05 DIAGNOSIS — Z713 Dietary counseling and surveillance: Secondary | ICD-10-CM | POA: Insufficient documentation

## 2016-03-05 DIAGNOSIS — I10 Essential (primary) hypertension: Secondary | ICD-10-CM | POA: Diagnosis not present

## 2016-03-05 DIAGNOSIS — E119 Type 2 diabetes mellitus without complications: Secondary | ICD-10-CM | POA: Diagnosis not present

## 2016-03-05 NOTE — Patient Instructions (Addendum)
Goals 1. Increase protein with all meals 2. Increase vegetables to 2 per meal 3. Breakfast: 1 egg, 1 cup greek yogurt and piece fruit and/or 1 slice toast 4. Increase exercise 4 times per week Lose 1-2 lbs per week Drink only water.. No sugar free substitutes Eat meals on time. Increase foods high in fiber Increase potassium rich foods

## 2016-03-05 NOTE — Progress Notes (Signed)
  Medical Nutrition Therapy:  Appt start time: 1000 end time:  1030.  Assessment:  Primary concerns today: Diabetes Type 2 and morbid obesity.  Lost another 4 lbs. Total lost now 11 lbs.  Still on phentermine. Still wants to a lot more lose weight. Glucose 97 on labs.  . She notes her PCP was impressed with her progress with all her labs and weight loss. Eating much better quality of foods. Hasn't gone to [facility] yet.   A1C 5.3-5.5% ??    Preferred Learning Style:    No preference indicated    Learning Readiness:     Change in progress   MEDICATIONS: See list   DIETARY INTAKE:  24-hr recall:  B ( AM):  Scrambled eggs, toast and 1 slice bacon or oatmeal water Snk ( AM): none  L ( PM): Half sandwich, water or unsweet tea with sweetner  Snk ( PM): none D ( PM): Brown rice, with chicken/steak and mixed vegetables or pancakes.. Snk ( PM): none Beverages: Water,   Usual physical activity: ADL: limited mobilty  Estimated energy needs: 1200 calories 135 g carbohydrates  90 g protein 33 g fat  Progress Towards Goal(s):  In progress.   Nutritional Diagnosis:  [place]-3.3 Overweight/obesity As related to excessve calorie intake.  As evidenced by BMI 64.   Intervention:  Nutrition and Diabetes education provided on My Plate, CHO counting, meal planning, portion sizes, timing of meals, avoiding snacks between meals unless having a low blood sugar, target ranges for A1C and blood sugars, signs/symptoms and treatment of hyper/hypoglycemia, monitoring blood sugars, taking medications as prescribed, benefits of exercising 30 minutes per day and prevention of complications of DM. Low Cholesterol High Fiber Low fat Low Sodium Diet.   Goals 1. Increase protein with all meals 2. Increase vegetables to 2 per meal 3. Breakfast: 1 egg, 1 cup greek yogurt and piece fruit and/or 1 slice toast 4. Increase exercise 4 times per week Lose 1-2 lbs per week Drink only water.. No sugar free  substitutes Eat meals on time. Increase foods high in fiber Increase potassium rich foods  Teaching Method Utilized:  Visual Auditory Hands on  Handouts given during visit include:  The Plate Method  Meal Plan Card  Diabetes Instructions  Barriers to learning/adherence to lifestyle change: none  Demonstrated degree of understanding via:  Teach Back   Monitoring/Evaluation:  Dietary intake, exercise, meal planning, and body weight in 3 month(s).

## 2016-03-06 DIAGNOSIS — Z881 Allergy status to other antibiotic agents status: Secondary | ICD-10-CM | POA: Diagnosis not present

## 2016-03-06 DIAGNOSIS — G8929 Other chronic pain: Secondary | ICD-10-CM | POA: Diagnosis not present

## 2016-03-06 DIAGNOSIS — M25562 Pain in left knee: Secondary | ICD-10-CM | POA: Diagnosis not present

## 2016-03-06 DIAGNOSIS — I10 Essential (primary) hypertension: Secondary | ICD-10-CM | POA: Diagnosis not present

## 2016-03-06 DIAGNOSIS — Z87891 Personal history of nicotine dependence: Secondary | ICD-10-CM | POA: Diagnosis not present

## 2016-03-06 DIAGNOSIS — M25561 Pain in right knee: Secondary | ICD-10-CM | POA: Diagnosis not present

## 2016-03-06 DIAGNOSIS — Z96653 Presence of artificial knee joint, bilateral: Secondary | ICD-10-CM | POA: Diagnosis not present

## 2016-03-06 DIAGNOSIS — Z882 Allergy status to sulfonamides status: Secondary | ICD-10-CM | POA: Diagnosis not present

## 2016-03-06 DIAGNOSIS — Z888 Allergy status to other drugs, medicaments and biological substances status: Secondary | ICD-10-CM | POA: Diagnosis not present

## 2016-03-06 DIAGNOSIS — Z880 Allergy status to penicillin: Secondary | ICD-10-CM | POA: Diagnosis not present

## 2016-03-12 DIAGNOSIS — G4733 Obstructive sleep apnea (adult) (pediatric): Secondary | ICD-10-CM | POA: Diagnosis not present

## 2016-03-19 DIAGNOSIS — T781XXA Other adverse food reactions, not elsewhere classified, initial encounter: Secondary | ICD-10-CM | POA: Diagnosis not present

## 2016-03-19 DIAGNOSIS — L272 Dermatitis due to ingested food: Secondary | ICD-10-CM | POA: Diagnosis not present

## 2016-03-19 DIAGNOSIS — T7840XA Allergy, unspecified, initial encounter: Secondary | ICD-10-CM | POA: Diagnosis not present

## 2016-03-19 DIAGNOSIS — T783XXA Angioneurotic edema, initial encounter: Secondary | ICD-10-CM | POA: Diagnosis not present

## 2016-03-19 DIAGNOSIS — J9801 Acute bronchospasm: Secondary | ICD-10-CM | POA: Diagnosis not present

## 2016-03-19 DIAGNOSIS — I10 Essential (primary) hypertension: Secondary | ICD-10-CM | POA: Diagnosis not present

## 2016-03-19 DIAGNOSIS — J301 Allergic rhinitis due to pollen: Secondary | ICD-10-CM | POA: Diagnosis not present

## 2016-03-19 DIAGNOSIS — R0682 Tachypnea, not elsewhere classified: Secondary | ICD-10-CM | POA: Diagnosis not present

## 2016-03-19 DIAGNOSIS — Z6841 Body Mass Index (BMI) 40.0 and over, adult: Secondary | ICD-10-CM | POA: Diagnosis not present

## 2016-03-19 DIAGNOSIS — Z91018 Allergy to other foods: Secondary | ICD-10-CM | POA: Diagnosis not present

## 2016-03-19 DIAGNOSIS — J9811 Atelectasis: Secondary | ICD-10-CM | POA: Diagnosis not present

## 2016-03-19 DIAGNOSIS — R062 Wheezing: Secondary | ICD-10-CM | POA: Diagnosis not present

## 2016-03-19 DIAGNOSIS — J45909 Unspecified asthma, uncomplicated: Secondary | ICD-10-CM | POA: Diagnosis not present

## 2016-03-19 DIAGNOSIS — M5136 Other intervertebral disc degeneration, lumbar region: Secondary | ICD-10-CM | POA: Diagnosis not present

## 2016-03-19 DIAGNOSIS — R69 Illness, unspecified: Secondary | ICD-10-CM | POA: Diagnosis not present

## 2016-03-20 DIAGNOSIS — T783XXA Angioneurotic edema, initial encounter: Secondary | ICD-10-CM | POA: Diagnosis not present

## 2016-03-24 DIAGNOSIS — Z6841 Body Mass Index (BMI) 40.0 and over, adult: Secondary | ICD-10-CM | POA: Diagnosis not present

## 2016-03-24 DIAGNOSIS — Z713 Dietary counseling and surveillance: Secondary | ICD-10-CM | POA: Diagnosis not present

## 2016-04-07 DIAGNOSIS — Z96653 Presence of artificial knee joint, bilateral: Secondary | ICD-10-CM | POA: Diagnosis not present

## 2016-04-07 DIAGNOSIS — M25561 Pain in right knee: Secondary | ICD-10-CM | POA: Diagnosis not present

## 2016-04-07 DIAGNOSIS — Z7951 Long term (current) use of inhaled steroids: Secondary | ICD-10-CM | POA: Diagnosis not present

## 2016-04-07 DIAGNOSIS — Z7982 Long term (current) use of aspirin: Secondary | ICD-10-CM | POA: Diagnosis not present

## 2016-04-07 DIAGNOSIS — Z6841 Body Mass Index (BMI) 40.0 and over, adult: Secondary | ICD-10-CM | POA: Diagnosis not present

## 2016-04-07 DIAGNOSIS — J45909 Unspecified asthma, uncomplicated: Secondary | ICD-10-CM | POA: Diagnosis not present

## 2016-04-07 DIAGNOSIS — I10 Essential (primary) hypertension: Secondary | ICD-10-CM | POA: Diagnosis not present

## 2016-04-07 DIAGNOSIS — G8929 Other chronic pain: Secondary | ICD-10-CM | POA: Diagnosis not present

## 2016-04-07 DIAGNOSIS — M25562 Pain in left knee: Secondary | ICD-10-CM | POA: Diagnosis not present

## 2016-04-11 DIAGNOSIS — G4733 Obstructive sleep apnea (adult) (pediatric): Secondary | ICD-10-CM | POA: Diagnosis not present

## 2016-04-15 DIAGNOSIS — M25562 Pain in left knee: Secondary | ICD-10-CM | POA: Diagnosis not present

## 2016-04-15 DIAGNOSIS — G8929 Other chronic pain: Secondary | ICD-10-CM | POA: Diagnosis not present

## 2016-04-15 DIAGNOSIS — M170 Bilateral primary osteoarthritis of knee: Secondary | ICD-10-CM | POA: Diagnosis not present

## 2016-04-15 DIAGNOSIS — M25561 Pain in right knee: Secondary | ICD-10-CM | POA: Diagnosis not present

## 2016-04-20 DIAGNOSIS — Z6841 Body Mass Index (BMI) 40.0 and over, adult: Secondary | ICD-10-CM | POA: Diagnosis not present

## 2016-04-20 DIAGNOSIS — L501 Idiopathic urticaria: Secondary | ICD-10-CM | POA: Diagnosis not present

## 2016-04-26 DIAGNOSIS — I248 Other forms of acute ischemic heart disease: Secondary | ICD-10-CM | POA: Diagnosis not present

## 2016-04-26 DIAGNOSIS — E1140 Type 2 diabetes mellitus with diabetic neuropathy, unspecified: Secondary | ICD-10-CM | POA: Diagnosis not present

## 2016-05-01 DIAGNOSIS — Z6841 Body Mass Index (BMI) 40.0 and over, adult: Secondary | ICD-10-CM | POA: Diagnosis not present

## 2016-05-15 DIAGNOSIS — G6289 Other specified polyneuropathies: Secondary | ICD-10-CM | POA: Diagnosis not present

## 2016-05-15 DIAGNOSIS — R69 Illness, unspecified: Secondary | ICD-10-CM | POA: Diagnosis not present

## 2016-05-15 DIAGNOSIS — E782 Mixed hyperlipidemia: Secondary | ICD-10-CM | POA: Diagnosis not present

## 2016-05-15 DIAGNOSIS — I10 Essential (primary) hypertension: Secondary | ICD-10-CM | POA: Diagnosis not present

## 2016-05-15 DIAGNOSIS — J301 Allergic rhinitis due to pollen: Secondary | ICD-10-CM | POA: Diagnosis not present

## 2016-05-15 DIAGNOSIS — G4733 Obstructive sleep apnea (adult) (pediatric): Secondary | ICD-10-CM | POA: Diagnosis not present

## 2016-05-26 ENCOUNTER — Encounter: Payer: Self-pay | Admitting: Allergy & Immunology

## 2016-05-26 ENCOUNTER — Ambulatory Visit (INDEPENDENT_AMBULATORY_CARE_PROVIDER_SITE_OTHER): Payer: Medicare HMO | Admitting: Allergy & Immunology

## 2016-05-26 VITALS — BP 130/80 | HR 94 | Temp 98.3°F | Resp 17

## 2016-05-26 DIAGNOSIS — T781XXD Other adverse food reactions, not elsewhere classified, subsequent encounter: Secondary | ICD-10-CM | POA: Diagnosis not present

## 2016-05-26 DIAGNOSIS — J4540 Moderate persistent asthma, uncomplicated: Secondary | ICD-10-CM

## 2016-05-26 DIAGNOSIS — J3081 Allergic rhinitis due to animal (cat) (dog) hair and dander: Secondary | ICD-10-CM

## 2016-05-26 NOTE — Progress Notes (Signed)
NEW PATIENT  Date of Service/Encounter:  05/26/16  Referring provider: Gar Ponto, MD   Assessment:   Chronic allergic rhinitis due to animal hair and dander  Moderate persistent asthma, uncomplicated  Adverse food reaction (lpossibly seafood and nuts)   Asthma Reportables:  Severity: severe persistent  Risk: high Control: not well controlled  Seasonal Influenza Vaccine: yes    Plan/Recommendations:   1. Chronic allergic rhinitis - We will get blood work to look for environmental allergens. - Continue with current loratadine 10mg  daily. - Continue with Flonase one spray per nostril daily. - Continue with Astelin one spray per nostril daily.  2. Moderate persistent asthma with acute exacerbation - complicated by chronic prednisone use and morbid obesity - Lung function looked awful today, but it did improve somewhat with the DuoNeb. - Restart all of your asthma medications. - Increase prednisone to 60mg  daily for five days, then decrease back to your normal daily dose. - We will get lab work to see if you qualify for any of the biologics that are approved to treat asthma ( Xolair or Nucala). - Consent obtained today.  - Daily controller medication(s): Breo 200/25 one inhalation once daily + prednisone 10mg  daily - Rescue medications: ProAir 4 puffs every 4-6 hours as needed, albuterol nebulizer one vial puffs every 4-6 hours as needed or DuoNeb nebulizer one vial every 4-6 hours as needed - Asthma control goals:  * Full participation in all desired activities (may need albuterol before activity) * Albuterol use two time or less a week on average (not counting use with activity) * Cough interfering with sleep two time or less a month * Oral steroids no more than once a year * No hospitalizations  3. Adverse food reaction - We will get testing today: seafood panel, nut panel, soy - EpiPen is up to date. - We will call you in 1-2 weeks with the results.  -  Continue to avoid nuts and seafood for now.   4. Chronic prednisone use - unclear of duration - Latest bone scan was normal, per patient (2017). - There is no history of diabetes, per patient. - There is no history of glaucoma or vision problems, per patient. - She does receive all of her recommend vaccinations. - There is no history of recurrent infections. - [name] does have a plan in place to increase prednisone during acute illnesses.   5. Return in about 4 weeks (around 06/23/2016).    Subjective:   [name] [name] is a 71 y.o. female presenting today for evaluation of  Chief Complaint  Patient presents with  . Allergic Reaction    Patient stated that she had a reaction to shrimp. She stated that she had to go the ED because she couldn't breath    [name] [name] has a history of the following: There are no active problems to display for this patient.   History obtained from: chart review and patient.  [name] [name] was referred by Gar Ponto, MD.     [name] [name] is a 71 y.o. female presenting with concern for Food allergies. However upon further discussion it seems that she has quite a complicated history including uncontrolled severe asthma as well as allergic rhinitis.  Asthma/Respiratory Symptom History:  [name] [name] has a long history of asthma. She has been on multiple medications in the past. Currently, she is on Breo 200/25 one inhalation once daily, Singulair 10 mg daily, and prednisone 10 mg daily. It is unclear how  long she has been on prednisone, but thinks that it has been years. She has had a normal bone density test as early as last year according to the patient. She has no problems with vision or glaucoma, and has no history of diabetes. She estimates that she gets burst in her steroids approximately twice per year. Last year she had a "good year" and was only hospitalized once. Prior to this, she was hospitalized 2-3 times per year. She has never been intubated  for her asthma. Her last platelet count in October 2017 showed an absolute eosinophil count of 168. Unfortunately, a pharmacist told her to get off of all of her medications in anticipation of the allergy visit today. Therefore she has been off of her controller medications for the past 3 days.  Allergic Rhinitis Symptom History:  [name] [name] does have a history of allergic rhinitis. She was on allergy shots around 10-15 years ago (Dr. Joaquim Lai in [place]). She was only on shots for around 1 year. Apparently this allergist since passed away. She is on Flonase 1 spray per nostril daily as well as Astelin 1 spray per nostril daily. She also takes loratadine 10 mg daily.  Food Allergy Symptom History:  [name] [name] is chief complaint today is evaluation of a shrimp allergy. Her son gave her some shrimp at a [place] in [place] (she was not in [employer] but at her home). This particular dish had shrimp and scallops. While she was eating it, she developed shortness of breath. Her son gave her a nebulizer treatment and she was given epinephrine in the field by EMS. She could not feel her tongue and had trouble breathing. She was itching but she is unsure whether she had hives. This occurred around one month ago. She has avoided shrimp since that time. She was taken to the ED and admitted for 3-4 days. She was treated with steroids and not admitted. She was given "shots on top of shots". Unfortunately we do not have records to their EMR.   Prior to this, she avoided shrimp. She has tolerated quite a bit of other seafood without a problem. She never had this reaction previously. She does have a reaction to salmon steaks where she will be a similar reaction. She can eat canned salmon and canned tuna. She has eaten catfish without a problem as well as tilapia.   She has reacted to peanut butter in the past with difficulty breathing. She was brought [employer] and peanut butter. She avoids  all peanuts and tree nuts now. She also avoids soy due to taste preferences. She does tolerate wheat, milk, and eggs.    [name] [name] does have a history of recurrent ear infections that required tympanostomy tube placement bilaterally. She is unable to give meaningful history of this. However, she continues to have a right persistent hole in her tympanic membrane. She estimates that she needs antibiotics around once per year at the most.   She does have a history of breast cancer in the distant past. She does see a registered dietitian routinely. Otherwise, there is no history of other atopic diseases, including stinging insect allergies or urticaria. She does have multiple drug allergies which we did not discuss today. Vaccinations are up to date. She did receive Pneumovax in February 2016.  CBC (October 2017 from PCP's office)    Past Medical History: There are no active problems to display for this patient.   Medication List:  Allergies as  of 05/26/2016      Reactions   Penicillin G Anaphylaxis   Aspirin Nausea And Vomiting   Lactose Other (See Comments)   Naproxen Nausea And Vomiting   Tetracycline Nausea And Vomiting   Codeine Rash   Doxycycline Rash   Epinephrine Rash   Erythromycin Base Rash   Ibuprofen Rash   Latex Rash   Piroxicam Rash   Salicylates Itching, Rash   Sulfamethoxazole Rash      Medication List       Accurate as of 05/26/16  4:27 PM. Always use your most recent med list.          alendronate 70 MG tablet Commonly known as:  FOSAMAX Take 70 mg by mouth.   amLODipine 10 MG tablet Commonly known as:  NORVASC Take 10 mg by mouth.   aspirin 81 MG tablet Take 81 mg by mouth every morning.   azelastine 0.1 % nasal spray Commonly known as:  ASTELIN Place into the nose.   escitalopram 10 MG tablet Commonly known as:  LEXAPRO Take 10 mg by mouth every morning.   furosemide 40 MG tablet Commonly known as:  LASIX Take by mouth.   loratadine 10 MG  tablet Commonly known as:  CLARITIN Take 10 mg by mouth.   losartan 50 MG tablet Commonly known as:  COZAAR Take by mouth.   losartan-hydrochlorothiazide 100-25 MG tablet Commonly known as:  HYZAAR Take by mouth.   montelukast 10 MG tablet Commonly known as:  SINGULAIR   oxyCODONE 5 MG/5ML solution Commonly known as:  ROXICODONE Take by mouth.   pantoprazole 40 MG tablet Commonly known as:  PROTONIX   phentermine 37.5 MG capsule Take 37.5 mg by mouth every morning.   potassium chloride SA 20 MEQ tablet Commonly known as:  K-DUR,KLOR-CON   predniSONE 10 MG tablet Commonly known as:  DELTASONE   PROAIR HFA 108 (90 Base) MCG/ACT inhaler Generic drug:  albuterol Inhale into the lungs.       Birth History: non-contributory.  Developmental History: non-contributory.   Past Surgical History: Past Surgical History:  Procedure Laterality Date  . BREAST SURGERY    . JOINT REPLACEMENT       Family History: Family History  Problem Relation Age of Onset  . Allergic rhinitis Neg Hx   . Angioedema Neg Hx   . Asthma Neg Hx   . Atopy Neg Hx   . Eczema Neg Hx   . Immunodeficiency Neg Hx   . Urticaria Neg Hx      Social History: Kathy lives at home with her husband. They live in a 30 year old home. There is carpeting in the bedrooms and one in the main living areas. They have electric heating and window units for cooling. There are no animals inside or outside the home. They do not have dust mite covers on her bedding. There is no smoke in the home. She is a retired Writer. She does not have any nursing care.   Review of Systems: a 14-point review of systems is pertinent for what is mentioned in HPI.  Otherwise, all other systems were negative. Constitutional: negative other than that listed in the HPI Eyes: negative other than that listed in the HPI Ears, nose, mouth, throat, and face: negative other than that listed in the HPI Respiratory: negative  other than that listed in the HPI Cardiovascular: negative other than that listed in the HPI Gastrointestinal: negative other than that listed in the HPI Genitourinary: negative other than  that listed in the HPI Integument: negative other than that listed in the HPI Hematologic: negative other than that listed in the HPI Musculoskeletal: negative other than that listed in the HPI Neurological: negative other than that listed in the HPI Allergy/Immunologic: negative other than that listed in the HPI    Objective:   Blood pressure 130/80, pulse 94, temperature 98.3 F (36.8 C), temperature source Oral, resp. rate 17, SpO2 95 %. There is no height or weight on file to calculate BMI.   Physical Exam:  General: Alert, interactive, in no acute distress. Obese female.  Eyes: No conjunctival injection present on the right, No conjunctival injection present on the left, PERRL bilaterally, No discharge on the right and No discharge on the left Ears: Right hole in the tympanic membrance, but otherwise normal, Right TM pearly gray with normal light reflex, Left TM pearly gray with normal light reflex and Left TM intact without perforation.  Nose/Throat: External nose within normal limits and septum midline, turbinates edematous with clear discharge, post-pharynx erythematous without cobblestoning in the posterior oropharynx. Tonsils 2+ without exudates Neck: Supple without thyromegaly. Adenopathy: no enlarged lymph nodes appreciated in the anterior cervical, occipital, axillary, epitrochlear, inguinal, or popliteal regions Lungs: Decreased breath sounds bilaterally without wheezing, rhonchi or rales. Increased work of breathing. No crackles appreciated CV: Normal S1/S2, no murmurs. Capillary refill <2 seconds.  Abdomen: Nondistended, nontender. No guarding or rebound tenderness. Bowel sounds Difficult to evaluate due to body habitus  Skin: Warm and dry, without lesions or rashes. Extremities:  No  clubbing, cyanosis or edema. Neuro:   Grossly intact. No focal deficits appreciated. Responsive to questions.  Diagnostic studies:  Spirometry: results abnormal (FEV1: 0.61/36%, FVC: 0.79/39%, FEV1/FVC: 76%).    Spirometry consistent with severe restrictive disease. Albuterol/Atrovent nebulizer treatment given in clinic with marked significant improvement. There was a 450 mL (57%) increase in the FVC and a 370 mL (62%) increase in the FEV1.  Allergy Studies:  Deferred due to pulmonary status   Salvatore Marvel, MD [facility]

## 2016-05-26 NOTE — Patient Instructions (Addendum)
1. Chronic allergic rhinitis - We will get blood work to look for environmental allergens. - Continue with current loratadine 10mg  daily. - Continue with Flonase one spray per nostril daily. - Continue with Astelin one spray per nostril daily.  2. Moderate persistent asthma, uncomplicated - Lung function looked awful today, but it did improve somewhat with the DuoNeb. - Restart all of your asthma medications. - Increase prednisone to 60mg  daily for five days, then decrease back to your normal daily dose. - We will get lab work to see if you qualify for Xolair (monthly injectable medication for asthma). - Consent obtained today.  - Daily controller medication(s): Breo 200/25 one inhalation once daily + prednisone 10mg  daily - Rescue medications: ProAir 4 puffs every 4-6 hours as needed, albuterol nebulizer one vial puffs every 4-6 hours as needed or DuoNeb nebulizer one vial every 4-6 hours as needed - Asthma control goals:  * Full participation in all desired activities (may need albuterol before activity) * Albuterol use two time or less a week on average (not counting use with activity) * Cough interfering with sleep two time or less a month * Oral steroids no more than once a year * No hospitalizations  3. Adverse food reaction - We will get testing today: seafood panel, nut panel, soy - EpiPen is up to date. - We will call you in 1-2 weeks with the results.  - Continue to avoid nuts and seafood for now.   4. Return in about 4 weeks (around 06/23/2016).  Please inform us of any Emergency Department visits, hospitalizations, or changes in symptoms. Call us before going to the ED for breathing or allergy symptoms since we might be able to fit you in for a sick visit. Feel free to contact us anytime with any questions, problems, or concerns.  It was a pleasure to meet you today! Best wishes in the [place] Year!   Websites that have reliable patient information: 1. American Academy of  Asthma, Allergy, and Immunology: www.aaaai.org 2. Food Allergy Research and Education (FARE): foodallergy.org 3. Mothers of Asthmatics: http://www.asthmacommunitynetwork.org 4. American College of Allergy, Asthma, and Immunology: www.acaai.org

## 2016-05-27 DIAGNOSIS — I248 Other forms of acute ischemic heart disease: Secondary | ICD-10-CM | POA: Diagnosis not present

## 2016-05-27 DIAGNOSIS — E1140 Type 2 diabetes mellitus with diabetic neuropathy, unspecified: Secondary | ICD-10-CM | POA: Diagnosis not present

## 2016-05-27 LAB — ALLERGY PANEL 18, NUT MIX GROUP
Coconut: <0.1 kU/L
Pecan Nut: <0.1 kU/L
Sesame Seed f10: <0.1 kU/L

## 2016-05-27 LAB — ALLERGY PANEL 19, SEAFOOD GROUP
Allergen, Salmon, f41: <0.1 kU/L
Crab: <0.1 kU/L
Lobster: <0.1 kU/L

## 2016-05-27 LAB — CP584 ZONE 3
Allergen, A. alternata, m6: <0.1 kU/L
Allergen, C. Herbarum, M2: <0.1 kU/L
Allergen, D pternoyssinus,d7: <0.1 kU/L
Allergen, Mucor Racemosus, M4: <0.1 kU/L
Allergen, Mulberry, t76: <0.1 kU/L
Allergen, Oak,t7: <0.1 kU/L
Allergen, P. notatum, m1: <0.1 kU/L
Allergen, S. Botryosum, m10: <0.1 kU/L
Aspergillus fumigatus, m3: <0.1 kU/L
Bahia Grass: <0.1 kU/L
Dog Dander: <0.1 kU/L
Elm IgE: <0.1 kU/L
Johnson Grass: <0.1 kU/L
Meadow Grass: <0.1 kU/L
Pecan/Hickory Tree IgE: <0.1 kU/L
Rough Pigweed  IgE: <0.1 kU/L

## 2016-05-27 LAB — IGE: IGE (IMMUNOGLOBULIN E), SERUM: 49 kU/L (ref <115)

## 2016-05-30 DIAGNOSIS — E1165 Type 2 diabetes mellitus with hyperglycemia: Secondary | ICD-10-CM | POA: Diagnosis not present

## 2016-05-30 DIAGNOSIS — Z6841 Body Mass Index (BMI) 40.0 and over, adult: Secondary | ICD-10-CM | POA: Diagnosis not present

## 2016-05-30 DIAGNOSIS — I87332 Chronic venous hypertension (idiopathic) with ulcer and inflammation of left lower extremity: Secondary | ICD-10-CM | POA: Diagnosis not present

## 2016-05-30 DIAGNOSIS — L03116 Cellulitis of left lower limb: Secondary | ICD-10-CM | POA: Diagnosis not present

## 2016-05-30 DIAGNOSIS — G6289 Other specified polyneuropathies: Secondary | ICD-10-CM | POA: Diagnosis not present

## 2016-05-30 DIAGNOSIS — I10 Essential (primary) hypertension: Secondary | ICD-10-CM | POA: Diagnosis not present

## 2016-06-05 DIAGNOSIS — E669 Obesity, unspecified: Secondary | ICD-10-CM | POA: Diagnosis not present

## 2016-06-05 DIAGNOSIS — Z6841 Body Mass Index (BMI) 40.0 and over, adult: Secondary | ICD-10-CM | POA: Diagnosis not present

## 2016-06-05 DIAGNOSIS — Z713 Dietary counseling and surveillance: Secondary | ICD-10-CM | POA: Diagnosis not present

## 2016-06-08 ENCOUNTER — Encounter: Payer: Medicare HMO | Attending: Family Medicine | Admitting: Nutrition

## 2016-06-08 NOTE — Progress Notes (Signed)
  Medical Nutrition Therapy:  Appt start time: 1000 end time:  1030.  Assessment:  Primary concerns today: Diabetes Type 2 and morbid obesity.  Lost another 4 lbs. Total lost now 13 lbs total. Still on Phentermine. Currenlty in a electric wheelchair and uses a walker. Changes: Watching portion sizes. Trying not to eat much past 8 pm. Caring for her aging mother. Neeing to lose 50 lbs before she can get knee surgery.  Eating better quality of foods. Hasn't gone to [facility] yet due to cellulitis in her leg. Says she'll go after that gets healed. Needs increased physical activity for weight loss. Unsure of last A1C.   Preferred Learning Style:    No preference indicated    Learning Readiness:     Change in progress   MEDICATIONS: See list   DIETARY INTAKE:  24-hr recall:  B ( AM):  Shredded wheat with milk Snk ( AM): none  L ( PM): Left overs or meat and some vegetables.  Snk ( PM): none D ( PM): [employer], with chicken/steak and mixed vegetables Snk ( PM): none Beverages: Water,   Usual physical activity: ADL: limited mobilty  Estimated energy needs: 1200 calories 135 g carbohydrates  90 g protein 33 g fat  Progress Towards Goal(s):  In progress.   Nutritional Diagnosis:  [place]-3.3 Overweight/obesity As related to excessve calorie intake.  As evidenced by BMI 64.   Intervention:  Nutrition and Diabetes education provided on My Plate, CHO counting, meal planning, portion sizes, timing of meals, avoiding snacks between meals unless having a low blood sugar, target ranges for A1C and blood sugars, signs/symptoms and treatment of hyper/hypoglycemia, monitoring blood sugars, taking medications as prescribed, benefits of exercising 30 minutes per day and prevention of complications of DM. Low Cholesterol High Fiber Low fat Low Sodium Diet.   Goals 1. Try to get into water aerobics after her cellulitis heals 2. Look up [employer] chair exercises and do for 30 minutes daily. 3. Avoid  processed foods 4. Increase low carb vegetables. 5. Increase fiber rich foods. 6.HI Protein Diet.  Teaching Method Utilized:  Visual Auditory Hands on  Handouts given during visit include:  The Plate Method  Meal Plan Card  Diabetes Instructions  Barriers to learning/adherence to lifestyle change: none  Demonstrated degree of understanding via:  Teach Back   Monitoring/Evaluation:  Dietary intake, exercise, meal planning, and body weight in 3 month(s).

## 2016-06-08 NOTE — Patient Instructions (Addendum)
Goals 1. Try to get into water aerobics after her cellulitis heals 2. Look up [employer] chair exercises and do for 30 minutes daily. 3. Avoid processed foods 4. Increase low carb vegetables. 5. Increase fiber rich foods. 6.HI Protein Diet.

## 2016-06-12 DIAGNOSIS — R110 Nausea: Secondary | ICD-10-CM | POA: Diagnosis not present

## 2016-06-12 DIAGNOSIS — Z9989 Dependence on other enabling machines and devices: Secondary | ICD-10-CM | POA: Diagnosis not present

## 2016-06-12 DIAGNOSIS — K219 Gastro-esophageal reflux disease without esophagitis: Secondary | ICD-10-CM | POA: Diagnosis not present

## 2016-06-12 DIAGNOSIS — J449 Chronic obstructive pulmonary disease, unspecified: Secondary | ICD-10-CM | POA: Diagnosis not present

## 2016-06-12 DIAGNOSIS — M810 Age-related osteoporosis without current pathological fracture: Secondary | ICD-10-CM | POA: Diagnosis not present

## 2016-06-12 DIAGNOSIS — Z79891 Long term (current) use of opiate analgesic: Secondary | ICD-10-CM | POA: Diagnosis not present

## 2016-06-12 DIAGNOSIS — J45909 Unspecified asthma, uncomplicated: Secondary | ICD-10-CM | POA: Diagnosis not present

## 2016-06-12 DIAGNOSIS — Z87891 Personal history of nicotine dependence: Secondary | ICD-10-CM | POA: Diagnosis not present

## 2016-06-12 DIAGNOSIS — E876 Hypokalemia: Secondary | ICD-10-CM | POA: Diagnosis not present

## 2016-06-12 DIAGNOSIS — I10 Essential (primary) hypertension: Secondary | ICD-10-CM | POA: Diagnosis not present

## 2016-06-12 DIAGNOSIS — J302 Other seasonal allergic rhinitis: Secondary | ICD-10-CM | POA: Diagnosis not present

## 2016-06-12 DIAGNOSIS — R600 Localized edema: Secondary | ICD-10-CM | POA: Diagnosis not present

## 2016-06-12 DIAGNOSIS — G4730 Sleep apnea, unspecified: Secondary | ICD-10-CM | POA: Diagnosis not present

## 2016-06-12 DIAGNOSIS — R69 Illness, unspecified: Secondary | ICD-10-CM | POA: Diagnosis not present

## 2016-06-12 DIAGNOSIS — Z0000 Encounter for general adult medical examination without abnormal findings: Secondary | ICD-10-CM | POA: Diagnosis not present

## 2016-06-12 DIAGNOSIS — Z7982 Long term (current) use of aspirin: Secondary | ICD-10-CM | POA: Diagnosis not present

## 2016-06-23 ENCOUNTER — Encounter: Payer: Self-pay | Admitting: Allergy & Immunology

## 2016-06-23 ENCOUNTER — Ambulatory Visit (INDEPENDENT_AMBULATORY_CARE_PROVIDER_SITE_OTHER): Payer: Medicare HMO | Admitting: Allergy & Immunology

## 2016-06-23 VITALS — BP 118/62 | HR 90 | Temp 98.1°F | Resp 17

## 2016-06-23 DIAGNOSIS — T781XXD Other adverse food reactions, not elsewhere classified, subsequent encounter: Secondary | ICD-10-CM | POA: Diagnosis not present

## 2016-06-23 DIAGNOSIS — Z7952 Long term (current) use of systemic steroids: Secondary | ICD-10-CM | POA: Diagnosis not present

## 2016-06-23 DIAGNOSIS — J4540 Moderate persistent asthma, uncomplicated: Secondary | ICD-10-CM

## 2016-06-23 DIAGNOSIS — T781XXA Other adverse food reactions, not elsewhere classified, initial encounter: Secondary | ICD-10-CM | POA: Insufficient documentation

## 2016-06-23 DIAGNOSIS — J3081 Allergic rhinitis due to animal (cat) (dog) hair and dander: Secondary | ICD-10-CM | POA: Diagnosis not present

## 2016-06-23 DIAGNOSIS — T7819XA Other adverse food reactions, not elsewhere classified, initial encounter: Secondary | ICD-10-CM | POA: Insufficient documentation

## 2016-06-23 NOTE — Progress Notes (Signed)
FOLLOW UP  Date of Service/Encounter:  06/23/16 c  Assessment:   Moderate persistent asthma, uncomplicated  Adverse food reaction - scallops ?  Chronic non-allergic rhinitis  Chronic prednisone therapy   Asthma Reportables:  Severity: moderate persistent  Risk: high Control: not well controlled   Plan/Recommendations:    Patient Instructions  1. Chronic non-allergic rhinitis - Continue with current loratadine 10mg  daily. - Continue with Flonase one spray per nostril daily. - Continue with Astelin one spray per nostril daily.  2. Moderate persistent asthma, uncomplicated - Lung function looked much better today.  - We will not make any changes for now. - We will check on the Xolair approval but we can give you your first dose next week.  - Daily controller medication(s): Breo 200/25 one inhalation once daily + prednisone 10mg  daily - Rescue medications: ProAir 4 puffs every 4-6 hours as needed, albuterol nebulizer one vial puffs every 4-6 hours as needed or DuoNeb nebulizer one vial every 4-6 hours as needed - Asthma control goals:  * Full participation in all desired activities (may need albuterol before activity) * Albuterol use two time or less a week on average (not counting use with activity) * Cough interfering with sleep two time or less a month * Oral steroids no more than once a year * No hospitalizations  3. Adverse food reaction (possibly scallops) - We will get testing today: scallop IgE - EpiPen is up to date.  4. Return in about 2 months (around 08/23/2016).  Subjective:   [name] [name] is a 71 y.o. female presenting today for follow up of  Chief Complaint  Patient presents with  . Allergic Rhinitis     Loratidine, Flonase, Astelin   . Asthma    [name] [name] has a history of the following: Patient Active Problem List   Diagnosis Date Noted  . On prednisone therapy 06/23/2016  . Moderate persistent asthma, uncomplicated [id number]  .  Chronic allergic rhinitis due to animal hair and dander 06/23/2016  . Adverse food reaction 06/23/2016    History obtained from: chart review and patient.  [name] [name] was referred by Gar Ponto, MD.     [name] [name] is a 70 y.o. female presenting for a follow up visit.  She was last seen one month ago for a new patient assessment. At that time, her lung function was rather atrocious with evidence of severe restriction. She did have some reversibility with nebulizer treatment. We started her on Breo 200/25 one inhalation once daily. We also had her increase her prednisone to 60 mg daily for 5 days and then decrease back to her baseline of 10 mg daily. We did not do allergy testing since her pulmonary function was so atrocious. We did send environmental allergy testing revealed blood that was negative. We continued her on loratadine, Flonase, and Astelin. She also reported an adverse food reaction to a disc containing shrimp, scallops, rice, and steak. This reaction ended up keeping her in the hospital for several days, although we were never able to review her discharge summary. In any case, we sent a seafood panel, nut panel, and soy IgE, all of which were negative. She does have an EpiPen at home.  Since last visit, she has done very well. She feels that her breathing is markedly improved with the use of her daily controller medication. She continues to have some shortness of breath, but attributes this to deconditioning. She has received a phone call from her pharmacy  regarding coverage of Xolair which we are initiating for asthma. Her rhinitis symptoms are well controlled at this time. She does endorse worsening symptoms in the spring and fall, which is interesting since her environmental allergy testing was negative. She also remains on her loratadine, Flonase, and Astelin. We did review her food allergy testing, all of which was negative. Unfortunately, scallops were not on the seafood panel  therefore we will send these today. She will continue to avoid shrimp due to history of worsening breathing.  Otherwise, there have been no changes to her past medical history, surgical history, family history, or social history.    Review of Systems: a 14-point review of systems is pertinent for what is mentioned in HPI.  Otherwise, all other systems were negative. Constitutional: negative other than that listed in the HPI Eyes: negative other than that listed in the HPI Ears, nose, mouth, throat, and face: negative other than that listed in the HPI Respiratory: negative other than that listed in the HPI Cardiovascular: negative other than that listed in the HPI Gastrointestinal: negative other than that listed in the HPI Genitourinary: negative other than that listed in the HPI Integument: negative other than that listed in the HPI Hematologic: negative other than that listed in the HPI Musculoskeletal: negative other than that listed in the HPI Neurological: negative other than that listed in the HPI Allergy/Immunologic: negative other than that listed in the HPI    Objective:   Blood pressure 118/62, pulse 90, temperature 98.1 F (36.7 C), temperature source Oral, resp. rate 17. There is no height or weight on file to calculate BMI.   Physical Exam:  General: Alert, interactive, in no acute distress. Obese female. Very pleasant.  Eyes: No conjunctival injection present on the right, No conjunctival injection present on the left, PERRL bilaterally, No discharge on the right and No discharge on the left Ears: Right hole in the tympanic membrance, but otherwise normal, Right TM pearly gray with normal light reflex, Left TM pearly gray with normal light reflex and Left TM intact without perforation.  Nose/Throat: External nose within normal limits and septum midline, turbinates edematous with clear discharge, post-pharynx erythematous without cobblestoning in the posterior  oropharynx. Tonsils 2+ without exudates Neck:   Supple without thyromegaly. Lungs:            Markedly improved pulmonary exam compared to the last visit. There are breath sounds bilaterally without wheezing, rhonchi or rales. Mildly increased work of breathing. No crackles appreciated CV:      Normal S1/S2, no murmurs. Capillary refill <2 seconds.  Abdomen: Nondistended, nontender. No guarding or rebound tenderness. Bowel sounds Difficult to evaluate due to body habitus  Skin:    Warm and dry, without lesions or rashes. Extremities:  No clubbing, cyanosis or edema. Neuro:   Grossly intact. No focal deficits appreciated. Responsive to questions.  Diagnostic studies:  Spirometry: results abnormal (FEV1: 1.35/82%, FVC: 2.44/122%, FEV1/FVC: 55%).    Spirometry consistent with moderate obstructive disease. Compared to her last spirometry, both her FEV1 and FVC have increased markedly.   Allergy Studies: None     Salvatore Marvel, MD [facility]

## 2016-06-23 NOTE — Patient Instructions (Addendum)
1. Chronic non-allergic rhinitis - Continue with current loratadine 10mg  daily. - Continue with Flonase one spray per nostril daily. - Continue with Astelin one spray per nostril daily.  2. Moderate persistent asthma, uncomplicated - Lung function looked much better today.  - We will not make any changes for now. - We will check on the Xolair approval but we can give you your first dose next week.  - Daily controller medication(s): Breo 200/25 one inhalation once daily + prednisone 10mg  daily - Rescue medications: ProAir 4 puffs every 4-6 hours as needed, albuterol nebulizer one vial puffs every 4-6 hours as needed or DuoNeb nebulizer one vial every 4-6 hours as needed - Asthma control goals:  * Full participation in all desired activities (may need albuterol before activity) * Albuterol use two time or less a week on average (not counting use with activity) * Cough interfering with sleep two time or less a month * Oral steroids no more than once a year * No hospitalizations  3. Adverse food reaction (possibly scallops) - We will get testing today: scallop IgE - EpiPen is up to date.  4. Return in about 2 months (around 08/23/2016).  Please inform us of any Emergency Department visits, hospitalizations, or changes in symptoms. Call us before going to the ED for breathing or allergy symptoms since we might be able to fit you in for a sick visit. Feel free to contact us anytime with any questions, problems, or concerns.  It was a pleasure to see you again today!   Websites that have reliable patient information: 1. American Academy of Asthma, Allergy, and Immunology: www.aaaai.org 2. Food Allergy Research and Education (FARE): foodallergy.org 3. Mothers of Asthmatics: http://www.asthmacommunitynetwork.org 4. American College of Allergy, Asthma, and Immunology: www.acaai.org

## 2016-06-24 DIAGNOSIS — I248 Other forms of acute ischemic heart disease: Secondary | ICD-10-CM | POA: Diagnosis not present

## 2016-06-24 DIAGNOSIS — E1140 Type 2 diabetes mellitus with diabetic neuropathy, unspecified: Secondary | ICD-10-CM | POA: Diagnosis not present

## 2016-06-24 LAB — ALLERGEN SCALLOPS F338: Scallop IgE: <0.1 kU/L

## 2016-07-07 ENCOUNTER — Ambulatory Visit (INDEPENDENT_AMBULATORY_CARE_PROVIDER_SITE_OTHER): Payer: Medicare HMO | Admitting: *Deleted

## 2016-07-07 ENCOUNTER — Ambulatory Visit: Payer: Medicare HMO | Admitting: *Deleted

## 2016-07-07 DIAGNOSIS — J4540 Moderate persistent asthma, uncomplicated: Secondary | ICD-10-CM

## 2016-07-09 MED ORDER — OMALIZUMAB 150 MG ~~LOC~~ SOLR
225.0000 mg | SUBCUTANEOUS | Status: DC
  Administered 2016-07-07 – 2016-08-04 (×2): 225 mg via SUBCUTANEOUS
  Administered 2016-09-08: 300 mg via SUBCUTANEOUS

## 2016-07-09 NOTE — Progress Notes (Signed)
Immunotherapy   Patient Details  Name: [name] [name] MRN: [id number] Date of Birth: 02-16-1946  07/07/2016  [name] : Patient started Xolair injection today.  A sample of 150 mg was given to patient.  Lot [id number] Expiration 01/2019.  Given in left arm.  Patient can only use left arm due to history of breast cancer. Following schedule: Patient will receive 225 mg at next injection.     Frequency: Every 14 days. Epi-Pen:Patient does have Epipen and has been instructed on proper use.  Consent signed and patient instructions given. Patient waited 60 minutes after injection and no reaction noted.  Maree Erie 07/09/2016, 4:28 PM

## 2016-07-20 DIAGNOSIS — L03116 Cellulitis of left lower limb: Secondary | ICD-10-CM | POA: Diagnosis not present

## 2016-07-20 DIAGNOSIS — J4541 Moderate persistent asthma with (acute) exacerbation: Secondary | ICD-10-CM | POA: Diagnosis not present

## 2016-07-20 DIAGNOSIS — R600 Localized edema: Secondary | ICD-10-CM | POA: Diagnosis not present

## 2016-07-21 ENCOUNTER — Ambulatory Visit: Payer: Medicare HMO

## 2016-07-25 DIAGNOSIS — I248 Other forms of acute ischemic heart disease: Secondary | ICD-10-CM | POA: Diagnosis not present

## 2016-07-25 DIAGNOSIS — E1140 Type 2 diabetes mellitus with diabetic neuropathy, unspecified: Secondary | ICD-10-CM | POA: Diagnosis not present

## 2016-07-28 ENCOUNTER — Ambulatory Visit: Payer: Medicare HMO

## 2016-07-30 DIAGNOSIS — Z6841 Body Mass Index (BMI) 40.0 and over, adult: Secondary | ICD-10-CM | POA: Diagnosis not present

## 2016-07-30 DIAGNOSIS — E669 Obesity, unspecified: Secondary | ICD-10-CM | POA: Diagnosis not present

## 2016-07-30 DIAGNOSIS — Z713 Dietary counseling and surveillance: Secondary | ICD-10-CM | POA: Diagnosis not present

## 2016-08-03 DIAGNOSIS — E782 Mixed hyperlipidemia: Secondary | ICD-10-CM | POA: Diagnosis not present

## 2016-08-03 DIAGNOSIS — G4733 Obstructive sleep apnea (adult) (pediatric): Secondary | ICD-10-CM | POA: Diagnosis not present

## 2016-08-03 DIAGNOSIS — Z9189 Other specified personal risk factors, not elsewhere classified: Secondary | ICD-10-CM | POA: Diagnosis not present

## 2016-08-03 DIAGNOSIS — E876 Hypokalemia: Secondary | ICD-10-CM | POA: Diagnosis not present

## 2016-08-03 DIAGNOSIS — E1165 Type 2 diabetes mellitus with hyperglycemia: Secondary | ICD-10-CM | POA: Diagnosis not present

## 2016-08-03 DIAGNOSIS — R69 Illness, unspecified: Secondary | ICD-10-CM | POA: Diagnosis not present

## 2016-08-03 DIAGNOSIS — I10 Essential (primary) hypertension: Secondary | ICD-10-CM | POA: Diagnosis not present

## 2016-08-04 ENCOUNTER — Ambulatory Visit (INDEPENDENT_AMBULATORY_CARE_PROVIDER_SITE_OTHER): Payer: Medicare HMO | Admitting: *Deleted

## 2016-08-04 ENCOUNTER — Ambulatory Visit: Payer: Medicare HMO

## 2016-08-04 DIAGNOSIS — J4540 Moderate persistent asthma, uncomplicated: Secondary | ICD-10-CM | POA: Diagnosis not present

## 2016-08-06 ENCOUNTER — Ambulatory Visit: Payer: Medicare Other | Admitting: Nutrition

## 2016-08-07 DIAGNOSIS — F331 Major depressive disorder, recurrent, moderate: Secondary | ICD-10-CM | POA: Diagnosis not present

## 2016-08-07 DIAGNOSIS — J4541 Moderate persistent asthma with (acute) exacerbation: Secondary | ICD-10-CM | POA: Diagnosis not present

## 2016-08-07 DIAGNOSIS — Z23 Encounter for immunization: Secondary | ICD-10-CM | POA: Diagnosis not present

## 2016-08-07 DIAGNOSIS — J301 Allergic rhinitis due to pollen: Secondary | ICD-10-CM | POA: Diagnosis not present

## 2016-08-07 DIAGNOSIS — I10 Essential (primary) hypertension: Secondary | ICD-10-CM | POA: Diagnosis not present

## 2016-08-07 DIAGNOSIS — K219 Gastro-esophageal reflux disease without esophagitis: Secondary | ICD-10-CM | POA: Diagnosis not present

## 2016-08-07 DIAGNOSIS — E782 Mixed hyperlipidemia: Secondary | ICD-10-CM | POA: Diagnosis not present

## 2016-08-07 DIAGNOSIS — Z1389 Encounter for screening for other disorder: Secondary | ICD-10-CM | POA: Diagnosis not present

## 2016-08-07 DIAGNOSIS — R69 Illness, unspecified: Secondary | ICD-10-CM | POA: Diagnosis not present

## 2016-08-07 DIAGNOSIS — G6289 Other specified polyneuropathies: Secondary | ICD-10-CM | POA: Diagnosis not present

## 2016-08-07 DIAGNOSIS — F411 Generalized anxiety disorder: Secondary | ICD-10-CM | POA: Diagnosis not present

## 2016-08-07 DIAGNOSIS — G4733 Obstructive sleep apnea (adult) (pediatric): Secondary | ICD-10-CM | POA: Diagnosis not present

## 2016-08-11 DIAGNOSIS — G4733 Obstructive sleep apnea (adult) (pediatric): Secondary | ICD-10-CM | POA: Diagnosis not present

## 2016-08-13 ENCOUNTER — Ambulatory Visit: Payer: Medicare Other | Admitting: Nutrition

## 2016-08-13 NOTE — Progress Notes (Deleted)
She is seeing an RD in [facility] and doesn't need to see both of [place]. Advised to keep appointments with RD in [place] since she is associated with system with knee MD. She verbalized understanding and agreed.

## 2016-08-17 DIAGNOSIS — Z1231 Encounter for screening mammogram for malignant neoplasm of breast: Secondary | ICD-10-CM | POA: Diagnosis not present

## 2016-08-25 ENCOUNTER — Encounter: Payer: Self-pay | Admitting: Allergy & Immunology

## 2016-08-25 ENCOUNTER — Ambulatory Visit (INDEPENDENT_AMBULATORY_CARE_PROVIDER_SITE_OTHER): Payer: Medicare HMO | Admitting: Allergy & Immunology

## 2016-08-25 VITALS — BP 130/80 | HR 76 | Temp 98.0°F | Resp 18

## 2016-08-25 DIAGNOSIS — Z7952 Long term (current) use of systemic steroids: Secondary | ICD-10-CM | POA: Diagnosis not present

## 2016-08-25 DIAGNOSIS — J4540 Moderate persistent asthma, uncomplicated: Secondary | ICD-10-CM | POA: Diagnosis not present

## 2016-08-25 DIAGNOSIS — J300 Vasomotor rhinitis: Secondary | ICD-10-CM

## 2016-08-25 DIAGNOSIS — T781XXD Other adverse food reactions, not elsewhere classified, subsequent encounter: Secondary | ICD-10-CM

## 2016-08-25 MED ORDER — FLUTICASONE PROPIONATE 50 MCG/ACT NA SUSP: 1.0000 | Freq: Every day | NASAL | 3 refills | Status: AC

## 2016-08-25 MED ORDER — AZELASTINE HCL 0.1 % NA SOLN: 1.0000 | Freq: Every day | NASAL | 3 refills | Status: AC

## 2016-08-25 MED ORDER — FLUTICASONE FUROATE-VILANTEROL 200-25 MCG/INH IN AEPB: 1.0000 | INHALATION_SPRAY | Freq: Every day | RESPIRATORY_TRACT | 3 refills | Status: DC

## 2016-08-25 NOTE — Progress Notes (Signed)
FOLLOW UP  Date of Service/Encounter:  08/25/16   Assessment:   Moderate persistent asthma, uncomplicated  Chronic vasomotor rhinitis (with negative sIgE testing 2018)  Adverse food reaction, subsequent encounter  On chronic prednisone therapy   Asthma Reportables:  Severity: moderate persistent  Risk: high Control: well controlled   Plan/Recommendations:   1. Chronic non-allergic rhinitis - Continue with current loratadine 10mg  daily. - Continue with Flonase one spray per nostril daily. - Continue with Astelin one spray per nostril daily.  2. Moderate persistent asthma, uncomplicated - Lung function looked much more normal today. - We will not make any changes for now. - Try calling the [facility] to give them this ICD Code: J45.40 - I will talk to Tammy about alternative injectable medications and any necessary copayments. - Daily controller medication(s): Breo 200/25 one inhalation once daily + prednisone 5mg  daily + Xolair monthly - Rescue medications: ProAir 4 puffs every 4-6 hours as needed, albuterol nebulizer one vial puffs every 4-6 hours as needed or DuoNeb nebulizer one vial every 4-6 hours as needed - Asthma control goals:  * Full participation in all desired activities (may need albuterol before activity) * Albuterol use two time or less a week on average (not counting use with activity) * Cough interfering with sleep two time or less a month * Oral steroids no more than once a year * No hospitalizations  3. Adverse food reaction (possibly scallops) - EpiPen is up to date. - Although testing has been negative in the past, the severity of her reaction warrants avoidance of all seafood since this was the predominant food present at the time.   4. Chronic prednisone use - unclear of duration - Latest bone scan was normal, per patient (2017). - There is no history of diabetes, per patient. - There is no history of glaucoma or vision problems, per  patient. - Routine eye exam pending in the next couple of weeks.  - She does receive all of her recommend vaccinations. - There is no history of recurrent infections. - There is a plan in place to increase prednisone during acute illnesses.   5. Return in about 3 months (around 11/25/2016).   Subjective:   [name] [name] is a 71 y.o. female presenting today for follow up of  Chief Complaint  Patient presents with  . Asthma    doing well    [name] [name] has a history of the following: Patient Active Problem List   Diagnosis Date Noted  . On prednisone therapy 06/23/2016  . Moderate persistent asthma, uncomplicated 09/32/3557  . Chronic allergic rhinitis due to animal hair and dander 06/23/2016  . Adverse food reaction 06/23/2016    History obtained from: chart review and patient.  [name] [name] was referred by Caryl Bis, MD.     [name] is a 71 y.o. female presenting for a follow up visit. She was last seen in March 2018. She has a history of moderate persistent asthma requiring chronic prednisone use. When I first saw her in February, we started her on Breo 200/25 one inhalation once daily. We also obtained a serum IgE level which was high enough to get Xolair approved. She has since received 2 injections. For her rhinitis, we continued her on loratadine 10 mg daily, fluticasone 1 spray per nostril daily, and Astelin one spray per nostril daily. We did send a serum specific IgE panel to environmental allergens which was completely negative. She also has anaphylaxis seafood restaurant,  her testing has been negative. I did recommend that she continue to avoid all seafood given the severity of the reaction.  Since the last visit, she has generally done fairly well. [name] asthma has been well controlled. She has not required rescue medication, experienced nocturnal awakenings due to lower respiratory symptoms, nor have activities of daily living been limited. She last used  her rescue inhaler two weeks ago. [name] does feel much better on the [facility] as well as a Xolair. She has received 2 injections of the Xolair without adverse event. Xolair is somewhat hampered by the fifth day $50 per injection. Tammy has recommended foundations that might be able to help her afford the injection area she has talked to the [facility], who told her that she needs "a specific diagnosis". She has told them that she has asthma, but apparently this was not enough. She is unsure if she can afford $50 per injection, as she is on a very fixed budget. However, she is quite happy with how well her breathing is remains on prednisone, but has decreased to 5 mg daily.    [name]  continues to take her Claritin, Flonase, and Astelin. Her nasal symptoms have been under good control. She continues to avoid all seafood and has not had an episode of anaphylaxis.   She will be seeing an orthopedic surgeon next week in anticipation of bilateral knee replacement this summer. She has had in her knees replaced around 18 years ago. She will also be seeing an eye doctor in the next couple of weeks for her checkup. Otherwise, there have been no changes to her past medical history, surgical history, family history, or social history. Her husband died this past year, and she has had some stress following his death since he did most of the financial stuff around the house. Her 83 year old mother recently had an episode where she fell, and she is now in a nursing home. She tells me that her mother is very depressed now. [name] also lives with one of her sons, who has PTSD secondary to surgery in the [place] war. This has provided additional stress to her life.    Review of Systems: a 14-point review of systems is pertinent for what is mentioned in HPI.  Otherwise, all other systems were negative. Constitutional: negative other than that listed in the HPI Eyes: negative other than that listed in the HPI Ears,  nose, mouth, throat, and face: negative other than that listed in the HPI Respiratory: negative other than that listed in the HPI Cardiovascular: negative other than that listed in the HPI Gastrointestinal: negative other than that listed in the HPI Genitourinary: negative other than that listed in the HPI Integument: negative other than that listed in the HPI Hematologic: negative other than that listed in the HPI Musculoskeletal: negative other than that listed in the HPI Neurological: negative other than that listed in the HPI Allergy/Immunologic: negative other than that listed in the HPI    Objective:   Blood pressure 130/80, pulse 76, temperature 98 F (36.7 C), temperature source Oral, resp. rate 18, SpO2 95 %. There is no height or weight on file to calculate BMI.   Physical Exam:  General:Alert, interactive, in no acute distress.Obese female. Very pleasant and talkative.  Eyes: No conjunctival injection present on the right, No conjunctival injection present on the left, PERRL bilaterally, No discharge on the right and No discharge on the left Ears: Stable right hole in the tympanic  membrane, but otherwise normal, Right TM pearly gray with normal light reflex, Left TM pearly gray with normal light reflex and Left TM intact without perforation.  Nose/Throat: External nose within normal limits and septum midline, turbinates edematouswith clear discharge, post-pharynx erythematouswith some mild cobblestoning in the posterior oropharynx. Tonsils 2+without exudates Neck:Supple without thyromegaly. Lungs: Markedly improved pulmonary exam compared to the last visit. There are breath sounds bilaterally without wheezing, rhonchi or rales.Mildly increased work of breathing. No crackles appreciated CV: Normal S1/S2, no murmurs.Capillary refill <2 seconds.  Skin:Warm and dry, without lesions or rashes. Extremities: No clubbing, cyanosis or edema. Neuro: Grossly intact.No  focal deficits appreciated. Responsive to questions.  Diagnostic studies:  Spirometry: results abnormal (FEV1: 1%, FVC: 1.10/67%, FEV1/FVC: 1.45/72%).    Spirometry consistent with possible restrictive disease. Overall, her pattern appears much less consistent with air trapping compared to previous tests.   Allergy Studies: none     Salvatore Marvel, MD [facility] [place]

## 2016-08-25 NOTE — Patient Instructions (Addendum)
1. Chronic non-allergic rhinitis - Continue with current loratadine 10mg  daily. - Continue with Flonase one spray per nostril daily. - Continue with Astelin one spray per nostril daily.  2. Moderate persistent asthma, uncomplicated - Lung function looked stable today.  - We will not make any changes for now. - Try calling the foundation to give them this ICD Code: J45.40 - I will talk to Tammy about alternative injectable medications and any necessary copayments. - Daily controller medication(s): Breo 200/25 one inhalation once daily + prednisone 10mg  daily + Xolair monthly - Rescue medications: ProAir 4 puffs every 4-6 hours as needed, albuterol nebulizer one vial puffs every 4-6 hours as needed or DuoNeb nebulizer one vial every 4-6 hours as needed - Asthma control goals:  * Full participation in all desired activities (may need albuterol before activity) * Albuterol use two time or less a week on average (not counting use with activity) * Cough interfering with sleep two time or less a month * Oral steroids no more than once a year * No hospitalizations  3. Adverse food reaction (possibly scallops) - EpiPen is up to date.  4. Return in about 3 months (around 11/25/2016).  Please inform us of any Emergency Department visits, hospitalizations, or changes in symptoms. Call us before going to the ED for breathing or allergy symptoms since we might be able to fit you in for a sick visit. Feel free to contact us anytime with any questions, problems, or concerns.  It was a pleasure to see you again today!   Websites that have reliable patient information: 1. American Academy of Asthma, Allergy, and Immunology: www.aaaai.org 2. Food Allergy Research and Education (FARE): foodallergy.org 3. Mothers of Asthmatics: http://www.asthmacommunitynetwork.org 4. American College of Allergy, Asthma, and Immunology: www.acaai.org

## 2016-08-26 ENCOUNTER — Encounter: Payer: Self-pay | Admitting: *Deleted

## 2016-09-01 ENCOUNTER — Ambulatory Visit: Payer: Medicare HMO

## 2016-09-03 DIAGNOSIS — Z79899 Other long term (current) drug therapy: Secondary | ICD-10-CM | POA: Diagnosis not present

## 2016-09-03 DIAGNOSIS — G8929 Other chronic pain: Secondary | ICD-10-CM | POA: Diagnosis not present

## 2016-09-03 DIAGNOSIS — I10 Essential (primary) hypertension: Secondary | ICD-10-CM | POA: Diagnosis not present

## 2016-09-03 DIAGNOSIS — M25561 Pain in right knee: Secondary | ICD-10-CM | POA: Diagnosis not present

## 2016-09-03 DIAGNOSIS — Z7982 Long term (current) use of aspirin: Secondary | ICD-10-CM | POA: Diagnosis not present

## 2016-09-03 DIAGNOSIS — M25562 Pain in left knee: Secondary | ICD-10-CM | POA: Diagnosis not present

## 2016-09-03 DIAGNOSIS — Z87891 Personal history of nicotine dependence: Secondary | ICD-10-CM | POA: Diagnosis not present

## 2016-09-03 DIAGNOSIS — Z96653 Presence of artificial knee joint, bilateral: Secondary | ICD-10-CM | POA: Diagnosis not present

## 2016-09-03 DIAGNOSIS — Z7951 Long term (current) use of inhaled steroids: Secondary | ICD-10-CM | POA: Diagnosis not present

## 2016-09-03 DIAGNOSIS — J45909 Unspecified asthma, uncomplicated: Secondary | ICD-10-CM | POA: Diagnosis not present

## 2016-09-08 ENCOUNTER — Ambulatory Visit: Payer: Medicare HMO

## 2016-09-08 ENCOUNTER — Ambulatory Visit (INDEPENDENT_AMBULATORY_CARE_PROVIDER_SITE_OTHER): Payer: Medicare HMO | Admitting: *Deleted

## 2016-09-08 DIAGNOSIS — Z87891 Personal history of nicotine dependence: Secondary | ICD-10-CM | POA: Diagnosis not present

## 2016-09-08 DIAGNOSIS — I10 Essential (primary) hypertension: Secondary | ICD-10-CM | POA: Diagnosis not present

## 2016-09-08 DIAGNOSIS — Z881 Allergy status to other antibiotic agents status: Secondary | ICD-10-CM | POA: Diagnosis not present

## 2016-09-08 DIAGNOSIS — Z886 Allergy status to analgesic agent status: Secondary | ICD-10-CM | POA: Diagnosis not present

## 2016-09-08 DIAGNOSIS — M4696 Unspecified inflammatory spondylopathy, lumbar region: Secondary | ICD-10-CM | POA: Diagnosis not present

## 2016-09-08 DIAGNOSIS — Z888 Allergy status to other drugs, medicaments and biological substances status: Secondary | ICD-10-CM | POA: Diagnosis not present

## 2016-09-08 DIAGNOSIS — Z880 Allergy status to penicillin: Secondary | ICD-10-CM | POA: Diagnosis not present

## 2016-09-08 DIAGNOSIS — J4540 Moderate persistent asthma, uncomplicated: Secondary | ICD-10-CM | POA: Diagnosis not present

## 2016-09-08 DIAGNOSIS — J45909 Unspecified asthma, uncomplicated: Secondary | ICD-10-CM | POA: Diagnosis not present

## 2016-09-08 DIAGNOSIS — G4730 Sleep apnea, unspecified: Secondary | ICD-10-CM | POA: Diagnosis not present

## 2016-09-08 DIAGNOSIS — Z885 Allergy status to narcotic agent status: Secondary | ICD-10-CM | POA: Diagnosis not present

## 2016-09-08 NOTE — Progress Notes (Signed)
Sample given today of Xolair 300 mg.  Lot Number [id number] with  Expiration 12/2019.  Given only in Left arm due to history of breast cancer.  Patient was given forms to fill out for patient assistance with [employer] and went over instructions on how to fill out forms and required documents with patient.

## 2016-09-10 MED ORDER — OMALIZUMAB 150 MG ~~LOC~~ SOLR
300.0000 mg | SUBCUTANEOUS | Status: AC
  Administered 2016-10-09 – 2018-05-27 (×20): 300 mg via SUBCUTANEOUS

## 2016-09-23 DIAGNOSIS — M4316 Spondylolisthesis, lumbar region: Secondary | ICD-10-CM | POA: Diagnosis not present

## 2016-09-23 DIAGNOSIS — M4186 Other forms of scoliosis, lumbar region: Secondary | ICD-10-CM | POA: Diagnosis not present

## 2016-09-23 DIAGNOSIS — M545 Low back pain: Secondary | ICD-10-CM | POA: Diagnosis not present

## 2016-09-23 DIAGNOSIS — M5136 Other intervertebral disc degeneration, lumbar region: Secondary | ICD-10-CM | POA: Diagnosis not present

## 2016-09-23 DIAGNOSIS — M4696 Unspecified inflammatory spondylopathy, lumbar region: Secondary | ICD-10-CM | POA: Diagnosis not present

## 2016-10-01 ENCOUNTER — Telehealth: Payer: Self-pay | Admitting: *Deleted

## 2016-10-01 NOTE — Telephone Encounter (Signed)
Called patient regarding [place] forms she filled out and turned in to [employer] on Monday.  Patient provided 2016 tax summary and informed patient that she needs to provide 2017 tax summary like we discussed on May 22 when she was in the office and we started completing the forms.  Patient stated she would locate her most recent tax documents and take them to the [place] office.  Patient was made aware again that until we get forms submitted and hear back if she is approved, she will have to pay her $50 copay for Xolair if she gets Xolair before possible approval.  Patient has already been given samples of Xolair on 2 different visits.

## 2016-10-06 ENCOUNTER — Ambulatory Visit: Payer: Medicare HMO

## 2016-10-08 ENCOUNTER — Telehealth: Payer: Self-pay | Admitting: *Deleted

## 2016-10-08 DIAGNOSIS — C50911 Malignant neoplasm of unspecified site of right female breast: Secondary | ICD-10-CM | POA: Diagnosis not present

## 2016-10-08 NOTE — Telephone Encounter (Signed)
Called and left VM to inform her that her Xolair was here in [place] office.  Patient is scheduled to have surgery on her back on Monday, June 25th.  Patient may want to come to [facility] office on Friday for Xolair instead of [place] due to surgery.  Will leave patient on schedule for Xolair injection on 10/20/16 in [place] unless I hear back from patient that she wants to come to [facility] 10/09/16.

## 2016-10-09 ENCOUNTER — Ambulatory Visit (INDEPENDENT_AMBULATORY_CARE_PROVIDER_SITE_OTHER): Payer: Medicare HMO | Admitting: *Deleted

## 2016-10-09 DIAGNOSIS — J4540 Moderate persistent asthma, uncomplicated: Secondary | ICD-10-CM

## 2016-10-09 NOTE — Telephone Encounter (Signed)
Patient called office this morning and set up appointment to come to [facility] office at 2:00 pm for Xolair injection.

## 2016-10-12 DIAGNOSIS — M47816 Spondylosis without myelopathy or radiculopathy, lumbar region: Secondary | ICD-10-CM | POA: Diagnosis not present

## 2016-10-20 ENCOUNTER — Ambulatory Visit: Payer: Medicare HMO

## 2016-10-30 DIAGNOSIS — J301 Allergic rhinitis due to pollen: Secondary | ICD-10-CM | POA: Diagnosis not present

## 2016-10-30 DIAGNOSIS — R062 Wheezing: Secondary | ICD-10-CM | POA: Diagnosis not present

## 2016-10-30 DIAGNOSIS — I517 Cardiomegaly: Secondary | ICD-10-CM | POA: Diagnosis not present

## 2016-10-30 DIAGNOSIS — K219 Gastro-esophageal reflux disease without esophagitis: Secondary | ICD-10-CM | POA: Diagnosis not present

## 2016-10-30 DIAGNOSIS — I110 Hypertensive heart disease with heart failure: Secondary | ICD-10-CM | POA: Diagnosis not present

## 2016-10-30 DIAGNOSIS — J80 Acute respiratory distress syndrome: Secondary | ICD-10-CM | POA: Diagnosis not present

## 2016-10-30 DIAGNOSIS — R0603 Acute respiratory distress: Secondary | ICD-10-CM | POA: Diagnosis not present

## 2016-10-30 DIAGNOSIS — R0682 Tachypnea, not elsewhere classified: Secondary | ICD-10-CM | POA: Diagnosis not present

## 2016-10-30 DIAGNOSIS — I5032 Chronic diastolic (congestive) heart failure: Secondary | ICD-10-CM | POA: Diagnosis not present

## 2016-10-30 DIAGNOSIS — R69 Illness, unspecified: Secondary | ICD-10-CM | POA: Diagnosis not present

## 2016-10-30 DIAGNOSIS — M5136 Other intervertebral disc degeneration, lumbar region: Secondary | ICD-10-CM | POA: Diagnosis not present

## 2016-10-30 DIAGNOSIS — Z6841 Body Mass Index (BMI) 40.0 and over, adult: Secondary | ICD-10-CM | POA: Diagnosis not present

## 2016-10-30 DIAGNOSIS — R479 Unspecified speech disturbances: Secondary | ICD-10-CM | POA: Diagnosis not present

## 2016-10-30 DIAGNOSIS — Z7952 Long term (current) use of systemic steroids: Secondary | ICD-10-CM | POA: Diagnosis not present

## 2016-10-30 DIAGNOSIS — J4541 Moderate persistent asthma with (acute) exacerbation: Secondary | ICD-10-CM | POA: Diagnosis not present

## 2016-10-31 DIAGNOSIS — I517 Cardiomegaly: Secondary | ICD-10-CM | POA: Diagnosis not present

## 2016-10-31 DIAGNOSIS — R0603 Acute respiratory distress: Secondary | ICD-10-CM | POA: Diagnosis not present

## 2016-10-31 DIAGNOSIS — J45901 Unspecified asthma with (acute) exacerbation: Secondary | ICD-10-CM | POA: Diagnosis not present

## 2016-11-10 ENCOUNTER — Ambulatory Visit (INDEPENDENT_AMBULATORY_CARE_PROVIDER_SITE_OTHER): Payer: Medicare HMO | Admitting: *Deleted

## 2016-11-10 DIAGNOSIS — J4540 Moderate persistent asthma, uncomplicated: Secondary | ICD-10-CM | POA: Diagnosis not present

## 2016-11-13 DIAGNOSIS — J4550 Severe persistent asthma, uncomplicated: Secondary | ICD-10-CM | POA: Diagnosis not present

## 2016-11-18 DIAGNOSIS — G8929 Other chronic pain: Secondary | ICD-10-CM | POA: Diagnosis not present

## 2016-11-18 DIAGNOSIS — G5603 Carpal tunnel syndrome, bilateral upper limbs: Secondary | ICD-10-CM | POA: Diagnosis not present

## 2016-11-18 DIAGNOSIS — M1990 Unspecified osteoarthritis, unspecified site: Secondary | ICD-10-CM | POA: Diagnosis not present

## 2016-11-18 DIAGNOSIS — M4696 Unspecified inflammatory spondylopathy, lumbar region: Secondary | ICD-10-CM | POA: Diagnosis not present

## 2016-11-18 DIAGNOSIS — J45998 Other asthma: Secondary | ICD-10-CM | POA: Diagnosis not present

## 2016-11-18 DIAGNOSIS — M25562 Pain in left knee: Secondary | ICD-10-CM | POA: Diagnosis not present

## 2016-11-18 DIAGNOSIS — M25561 Pain in right knee: Secondary | ICD-10-CM | POA: Diagnosis not present

## 2016-11-18 DIAGNOSIS — Z8505 Personal history of malignant neoplasm of liver: Secondary | ICD-10-CM | POA: Diagnosis not present

## 2016-11-18 DIAGNOSIS — M47816 Spondylosis without myelopathy or radiculopathy, lumbar region: Secondary | ICD-10-CM | POA: Diagnosis not present

## 2016-11-18 DIAGNOSIS — Z6841 Body Mass Index (BMI) 40.0 and over, adult: Secondary | ICD-10-CM | POA: Diagnosis not present

## 2016-12-01 ENCOUNTER — Ambulatory Visit (INDEPENDENT_AMBULATORY_CARE_PROVIDER_SITE_OTHER): Payer: Medicare HMO | Admitting: Allergy & Immunology

## 2016-12-01 ENCOUNTER — Encounter: Payer: Self-pay | Admitting: Allergy & Immunology

## 2016-12-01 VITALS — BP 118/76 | HR 77 | Resp 20

## 2016-12-01 DIAGNOSIS — J300 Vasomotor rhinitis: Secondary | ICD-10-CM

## 2016-12-01 DIAGNOSIS — Z7952 Long term (current) use of systemic steroids: Secondary | ICD-10-CM

## 2016-12-01 DIAGNOSIS — T781XXD Other adverse food reactions, not elsewhere classified, subsequent encounter: Secondary | ICD-10-CM

## 2016-12-01 DIAGNOSIS — J4540 Moderate persistent asthma, uncomplicated: Secondary | ICD-10-CM | POA: Diagnosis not present

## 2016-12-01 DIAGNOSIS — J4550 Severe persistent asthma, uncomplicated: Secondary | ICD-10-CM | POA: Diagnosis not present

## 2016-12-01 NOTE — Patient Instructions (Addendum)
1. Chronic non-allergic rhinitis - Continue with current loratadine 10mg  daily. - Continue with Flonase one spray per nostril daily. - Continue with Astelin one spray per nostril daily.  2. Moderate persistent asthma, uncomplicated - Lung function looked even better today, despite the recent hospital stay. - We will not make any changes for now. - Daily controller medication(s): Breo 200/25 one inhalation once daily + prednisone 5mg  daily + Xolair monthly - Rescue medications: ProAir 4 puffs every 4-6 hours as needed, albuterol nebulizer one vial puffs every 4-6 hours as needed or DuoNeb nebulizer one vial every 4-6 hours as needed - Asthma control goals:  * Full participation in all desired activities (may need albuterol before activity) * Albuterol use two time or less a week on average (not counting use with activity) * Cough interfering with sleep two time or less a month * Oral steroids no more than once a year * No hospitalizations  3. Adverse food reaction (possibly scallops) - EpiPen is up to date.  4. Return in about 3 months (around 03/03/2017).  Please inform us of any Emergency Department visits, hospitalizations, or changes in symptoms. Call us before going to the ED for breathing or allergy symptoms since we might be able to fit you in for a sick visit. Feel free to contact us anytime with any questions, problems, or concerns.  It was a pleasure to see you again today! Enjoy the rest of your summer!   Websites that have reliable patient information: 1. American Academy of Asthma, Allergy, and Immunology: www.aaaai.org 2. Food Allergy Research and Education (FARE): foodallergy.org 3. Mothers of Asthmatics: http://www.asthmacommunitynetwork.org 4. American College of Allergy, Asthma, and Immunology: www.acaai.org   Election Day is coming up on Tuesday, November 6th! Make your voice heard! Register to vote at [email or web address]!

## 2016-12-01 NOTE — Progress Notes (Signed)
FOLLOW UP  Date of Service/Encounter:  12/01/16   Assessment:   Moderate persistent asthma, uncomplicated  Chronic vasomotor rhinitis (with negative sIgE testing 2018)  Adverse food reaction, subsequent encounter  On chronic prednisone therapy  Asthma Reportables:  Severity: moderate persistent  Risk: high Control: well controlled   Plan/Recommendations:   1. Chronic non-allergic rhinitis - Continue with current loratadine 10mg  daily. - Continue with Flonase one spray per nostril daily. - Continue with Astelin one spray per nostril daily.  2. Moderate persistent asthma, uncomplicated - Lung function looked much more normal today. - We will not make any changes for now. [name] [name], [name] [name] has her copay assistance in place through the [employer].  - Daily controller medication(s): Breo 200/25 one inhalation once daily + prednisone 5mg  daily + Xolair monthly - Rescue medications: ProAir 4 puffs every 4-6 hours as needed, albuterol nebulizer one vial puffs every 4-6 hours as needed or DuoNeb nebulizer one vial every 4-6 hours as needed - Asthma control goals:  * Full participation in all desired activities (may need albuterol before activity) * Albuterol use two time or less a week on average (not counting use with activity) * Cough interfering with sleep two time or less a month * Oral steroids no more than once a year * No hospitalizations  3. Adverse food reaction (possibly scallops) - EpiPen is up to date. - Although testing has been negative in the past, the severity of her reaction warrants avoidance of all seafood since this was the predominant food present at the time.   4. Chronic prednisone use - unclear of duration - Latest bone scan was normal, per patient (2017). - There is no history of diabetes, per patient. - There is no history of glaucoma or vision problems, per patient. - Routine eye exam pending in the next couple of weeks.    - She does receive all of her recommend vaccinations. - There is no history of recurrent infections. - There is a plan in place to increase prednisone during acute illnesses.   5. Return in about 3 months (around 11/25/2016).  Subjective:   [name] [name] is a 71 y.o. female presenting today for follow up of  Chief Complaint  Patient presents with  . Asthma    Controlled with Breo 200/25.   [facility] Allergy Testing    Controlled with Claritin     [name] [name] has a history of the following: Patient Active Problem List   Diagnosis Date Noted  . On prednisone therapy 06/23/2016  . Moderate persistent asthma, uncomplicated 12/87/8676  . Chronic allergic rhinitis due to animal hair and dander 06/23/2016  . Adverse food reaction 06/23/2016    History obtained from: chart review and patient.  [name] [name]'s Primary Care Provider is Caryl Bis, MD.     [name] [name] is a 71 y.o. female presenting for a follow up visit. She was last seen in May 2018. At that time, we continued her on breathing 200/25 g 1 puff once daily as well as prednisone 5 mg daily and Xolair monthly. Despite negative testing, I recommended continued avoidance of scallops due to her severe reaction.  Since the last visit, she has not done well. She was admitted to [facility] when her asthma was triggered with spray. She received a lot of nebulizer treatments. Her steroids were increased for five days. She is now returned to 5mg  daily. [name] [name] continues to work well. Xolair shots are going well  and she feels that this has provided some relief for her. Hospitalization aside, she has actually done quite well from an asthma perspective. Otherwise, [name] [name]'s asthma has been well controlled. She has not required rescue medication, experienced nocturnal awakenings due to lower respiratory symptoms, nor have activities of daily living been limited. She has required no Emergency Department or Urgent Care visits for her  asthma. She has required four courses of systemic steroids for asthma exacerbations since the last visit. ACT score today is 12 (due to recent hospitalization), indicating subpar asthma symptom control.  [name] [name] rhinitis symptoms have been well-controlled on the current rhinitis regimen. She has had no other allergic reactions. [name] [name] never had knee surgery. She had pain blocks on her back. She is doing back on August 30th for another attempt at this. She still needs knee replacements, but they do not want to do that now. Her grandson is staying with her this summer and she has been having a nice time with that.   Otherwise, there have been no changes to her past medical history, surgical history, family history, or social history. Otherwise, there have been no changes to her past medical history, surgical history, family history, or social history. Her husband died this past year, and she has had some stress following his death since he did most of the financial stuff around the house. Her 95 year old mother recently had an episode where she fell, and she is now in a nursing home. She tells me that her mother is very depressed now. [name] [name] also lives with one of her sons, who has PTSD secondary to serving in the [place] war. This has provided additional stress to her life.     Review of Systems: a 14-point review of systems is pertinent for what is mentioned in HPI.  Otherwise, all other systems were negative. Constitutional: negative other than that listed in the HPI Eyes: negative other than that listed in the HPI Ears, nose, mouth, throat, and face: negative other than that listed in the HPI Respiratory: negative other than that listed in the HPI Cardiovascular: negative other than that listed in the HPI Gastrointestinal: negative other than that listed in the HPI Genitourinary: negative other than that listed in the HPI Integument: negative other than that listed in the  HPI Hematologic: negative other than that listed in the HPI Musculoskeletal: negative other than that listed in the HPI Neurological: negative other than that listed in the HPI Allergy/Immunologic: negative other than that listed in the HPI    Objective:   Blood pressure 118/76, pulse 77, resp. rate 20, SpO2 95 %. There is no height or weight on file to calculate BMI.   Physical Exam:  General:Alert, interactive, in no acute distress.Obese female. Very pleasant and talkative.  Eyes: No conjunctival injection present on the right, No conjunctival injection present on the left, PERRL bilaterally, No discharge on the right and No discharge on the left Ears: Stable right hole in the tympanic membrane, but otherwise normal, Right TM pearly gray with normal light reflex, Left TM pearly gray with normal light reflex and Left TM intact without perforation.  Nose/Throat: External nose within normal limits and septum midline, turbinates edematouswith clear discharge, post-pharynx erythematouswith some mild cobblestoning in the posterior oropharynx. Tonsils 2+without exudates Neck:Supple without thyromegaly. Lungs: Markedly improved pulmonary exam compared to the last visit. There are breath sounds bilaterally without wheezing, rhonchi or rales.Mildly increased work of breathing. No crackles appreciated CV: Normal S1/S2, no murmurs.Capillary refill <  2 seconds.  Skin:Warm and dry, without lesions or rashes. Extremities: No clubbing, cyanosis or edema. Neuro: Grossly intact.No focal deficits appreciated. Responsive to questions.   Diagnostic studies:   Spirometry: results normal (FEV1: 1.25/76%, FVC: 2.02/101%, FEV1/FVC: 61%).    Spirometry consistent with normal pattern for her. Compared to her last spirometry, her numbers are actually improved.   Allergy Studies: none     Salvatore Marvel, MD [facility] [place]

## 2016-12-08 ENCOUNTER — Ambulatory Visit (INDEPENDENT_AMBULATORY_CARE_PROVIDER_SITE_OTHER): Payer: Medicare HMO | Admitting: *Deleted

## 2016-12-08 DIAGNOSIS — J4540 Moderate persistent asthma, uncomplicated: Secondary | ICD-10-CM

## 2016-12-11 DIAGNOSIS — M549 Dorsalgia, unspecified: Secondary | ICD-10-CM | POA: Diagnosis not present

## 2016-12-11 DIAGNOSIS — M25561 Pain in right knee: Secondary | ICD-10-CM | POA: Diagnosis not present

## 2016-12-11 DIAGNOSIS — M25562 Pain in left knee: Secondary | ICD-10-CM | POA: Diagnosis not present

## 2016-12-11 DIAGNOSIS — M533 Sacrococcygeal disorders, not elsewhere classified: Secondary | ICD-10-CM | POA: Diagnosis not present

## 2016-12-11 DIAGNOSIS — M79604 Pain in right leg: Secondary | ICD-10-CM | POA: Diagnosis not present

## 2016-12-11 DIAGNOSIS — Z87891 Personal history of nicotine dependence: Secondary | ICD-10-CM | POA: Diagnosis not present

## 2016-12-11 DIAGNOSIS — Z859 Personal history of malignant neoplasm, unspecified: Secondary | ICD-10-CM | POA: Diagnosis not present

## 2016-12-11 DIAGNOSIS — M79605 Pain in left leg: Secondary | ICD-10-CM | POA: Diagnosis not present

## 2016-12-11 DIAGNOSIS — J45909 Unspecified asthma, uncomplicated: Secondary | ICD-10-CM | POA: Diagnosis not present

## 2016-12-30 DIAGNOSIS — C50911 Malignant neoplasm of unspecified site of right female breast: Secondary | ICD-10-CM | POA: Diagnosis not present

## 2017-01-05 ENCOUNTER — Ambulatory Visit: Payer: Medicare HMO

## 2017-01-11 DIAGNOSIS — M533 Sacrococcygeal disorders, not elsewhere classified: Secondary | ICD-10-CM | POA: Diagnosis not present

## 2017-01-15 DIAGNOSIS — M25562 Pain in left knee: Secondary | ICD-10-CM | POA: Diagnosis not present

## 2017-01-15 DIAGNOSIS — M25561 Pain in right knee: Secondary | ICD-10-CM | POA: Diagnosis not present

## 2017-01-15 DIAGNOSIS — Z859 Personal history of malignant neoplasm, unspecified: Secondary | ICD-10-CM | POA: Diagnosis not present

## 2017-01-15 DIAGNOSIS — J45909 Unspecified asthma, uncomplicated: Secondary | ICD-10-CM | POA: Diagnosis not present

## 2017-01-15 DIAGNOSIS — Z6841 Body Mass Index (BMI) 40.0 and over, adult: Secondary | ICD-10-CM | POA: Diagnosis not present

## 2017-01-15 DIAGNOSIS — G8929 Other chronic pain: Secondary | ICD-10-CM | POA: Diagnosis not present

## 2017-01-15 DIAGNOSIS — G894 Chronic pain syndrome: Secondary | ICD-10-CM | POA: Diagnosis not present

## 2017-01-26 ENCOUNTER — Ambulatory Visit (INDEPENDENT_AMBULATORY_CARE_PROVIDER_SITE_OTHER): Payer: Medicare HMO

## 2017-01-26 DIAGNOSIS — J4540 Moderate persistent asthma, uncomplicated: Secondary | ICD-10-CM

## 2017-02-08 DIAGNOSIS — M170 Bilateral primary osteoarthritis of knee: Secondary | ICD-10-CM | POA: Diagnosis not present

## 2017-02-08 DIAGNOSIS — M25561 Pain in right knee: Secondary | ICD-10-CM | POA: Diagnosis not present

## 2017-02-15 DIAGNOSIS — E782 Mixed hyperlipidemia: Secondary | ICD-10-CM | POA: Diagnosis not present

## 2017-02-15 DIAGNOSIS — Z9189 Other specified personal risk factors, not elsewhere classified: Secondary | ICD-10-CM | POA: Diagnosis not present

## 2017-02-15 DIAGNOSIS — E876 Hypokalemia: Secondary | ICD-10-CM | POA: Diagnosis not present

## 2017-02-15 DIAGNOSIS — R69 Illness, unspecified: Secondary | ICD-10-CM | POA: Diagnosis not present

## 2017-02-15 DIAGNOSIS — I10 Essential (primary) hypertension: Secondary | ICD-10-CM | POA: Diagnosis not present

## 2017-02-15 DIAGNOSIS — K210 Gastro-esophageal reflux disease with esophagitis: Secondary | ICD-10-CM | POA: Diagnosis not present

## 2017-02-15 DIAGNOSIS — I87332 Chronic venous hypertension (idiopathic) with ulcer and inflammation of left lower extremity: Secondary | ICD-10-CM | POA: Diagnosis not present

## 2017-02-15 DIAGNOSIS — E1165 Type 2 diabetes mellitus with hyperglycemia: Secondary | ICD-10-CM | POA: Diagnosis not present

## 2017-02-18 DIAGNOSIS — E876 Hypokalemia: Secondary | ICD-10-CM | POA: Diagnosis not present

## 2017-02-18 DIAGNOSIS — Z6841 Body Mass Index (BMI) 40.0 and over, adult: Secondary | ICD-10-CM | POA: Diagnosis not present

## 2017-02-18 DIAGNOSIS — Z23 Encounter for immunization: Secondary | ICD-10-CM | POA: Diagnosis not present

## 2017-02-18 DIAGNOSIS — I872 Venous insufficiency (chronic) (peripheral): Secondary | ICD-10-CM | POA: Diagnosis not present

## 2017-02-18 DIAGNOSIS — E782 Mixed hyperlipidemia: Secondary | ICD-10-CM | POA: Diagnosis not present

## 2017-02-18 DIAGNOSIS — Z0001 Encounter for general adult medical examination with abnormal findings: Secondary | ICD-10-CM | POA: Diagnosis not present

## 2017-02-18 DIAGNOSIS — G6289 Other specified polyneuropathies: Secondary | ICD-10-CM | POA: Diagnosis not present

## 2017-02-18 DIAGNOSIS — I10 Essential (primary) hypertension: Secondary | ICD-10-CM | POA: Diagnosis not present

## 2017-02-23 ENCOUNTER — Ambulatory Visit (INDEPENDENT_AMBULATORY_CARE_PROVIDER_SITE_OTHER): Payer: Medicare HMO | Admitting: *Deleted

## 2017-02-23 DIAGNOSIS — J4540 Moderate persistent asthma, uncomplicated: Secondary | ICD-10-CM

## 2017-03-04 DIAGNOSIS — M25562 Pain in left knee: Secondary | ICD-10-CM | POA: Diagnosis not present

## 2017-03-04 DIAGNOSIS — G8929 Other chronic pain: Secondary | ICD-10-CM | POA: Diagnosis not present

## 2017-03-04 DIAGNOSIS — Z96653 Presence of artificial knee joint, bilateral: Secondary | ICD-10-CM | POA: Diagnosis not present

## 2017-03-04 DIAGNOSIS — M25561 Pain in right knee: Secondary | ICD-10-CM | POA: Diagnosis not present

## 2017-03-16 ENCOUNTER — Encounter: Payer: Self-pay | Admitting: Allergy & Immunology

## 2017-03-16 ENCOUNTER — Ambulatory Visit (INDEPENDENT_AMBULATORY_CARE_PROVIDER_SITE_OTHER): Payer: Medicare HMO | Admitting: Allergy & Immunology

## 2017-03-16 VITALS — BP 140/75 | HR 75 | Temp 98.2°F | Resp 18

## 2017-03-16 DIAGNOSIS — T7802XD Anaphylactic reaction due to shellfish (crustaceans), subsequent encounter: Secondary | ICD-10-CM

## 2017-03-16 DIAGNOSIS — J4540 Moderate persistent asthma, uncomplicated: Secondary | ICD-10-CM

## 2017-03-16 DIAGNOSIS — J300 Vasomotor rhinitis: Secondary | ICD-10-CM

## 2017-03-16 NOTE — Progress Notes (Signed)
FOLLOW UP  Date of Service/Encounter:  03/16/17   Assessment:   Moderate persistent asthma, uncomplicated  Chronic vasomotor rhinitis (with negative sIgE testing 2018)  Adverse food reaction (seafood)  On chronic prednisone therapy   Asthma Reportables:  Severity: moderate persistent  Risk: high Control: well controlled   Plan/Recommendations:   1. Chronic non-allergic rhinitis - Continue with current loratadine 10mg  daily. - Continue with Flonase one spray per nostril daily. - Continue with Astelin one spray per nostril daily.  2. Moderate persistent asthma, uncomplicated - Lung function looked even better today. - We will not make any changes for now. - Daily controller medication(s): Breo 200/25 one inhalation once daily + prednisone 5mg  daily + Xolair monthly - Rescue medications: ProAir 4 puffs every 4-6 hours as needed, albuterol nebulizer one vial puffs every 4-6 hours as needed or DuoNeb nebulizer one vial every 4-6 hours as needed - Asthma control goals:  * Full participation in all desired activities (may need albuterol before activity) * Albuterol use two time or less a week on average (not counting use with activity) * Cough interfering with sleep two time or less a month * Oral steroids no more than once a year * No hospitalizations  3. Adverse food reaction (possibly scallops) - EpiPen is up to date.  4. Return in about 6 months (around 09/13/2017).  Subjective:   [name] [name] is a 71 y.o. female presenting today for follow up of  Chief Complaint  Patient presents with  . Asthma    [name] [name] has a history of the following: Patient Active Problem List   Diagnosis Date Noted  . On prednisone therapy 06/23/2016  . Moderate persistent asthma, uncomplicated 23/30/0762  . Chronic allergic rhinitis due to animal hair and dander 06/23/2016  . Adverse food reaction 06/23/2016    History obtained from: chart review and  patient.  [name] [name]'s Primary Care Provider is Caryl Bis, MD.     [name] [name] is a 71 y.o. female presenting for a follow up visit. She was last seen in August 2018. At that time, we continued her on Breo 200/25 one puff once daily as well as prednisone daily and Xolair monthly. She has been on prednisone for concurrent arthritis. I recommended continued avoidance of seafood. I recommended continued use of Claritin 10mg  daily as well as Flonase one spray per nostril daily and Astelin one spray per nostril daily.   Since the last visit, she has done well. She does feel that the Xolair is helping her breathing. [name] [name]'s asthma has been well controlled. She has not required rescue medication, experienced nocturnal awakenings due to lower respiratory symptoms, nor have activities of daily living been limited. She has required no Emergency Department or Urgent Care visits for her asthma. She has required zero courses of systemic steroid bursts for asthma exacerbations since the last visit. ACT score today is 21, indicating excellent asthma symptom control.   Rhinitis symptoms are well controlled with the current regimen of nasal sprays. She endorses excellent compliance with this. She is having some continued chronic pain problems. She cooked a large Thanksgiving dinner which apparently turned out well.   Otherwise, there have been no changes to her past medical history, surgical history, family history, or social history.    Review of Systems: a 14-point review of systems is pertinent for what is mentioned in HPI.  Otherwise, all other systems were negative. Constitutional: negative other than that listed in the HPI Eyes:  negative other than that listed in the HPI Ears, nose, mouth, throat, and face: negative other than that listed in the HPI Respiratory: negative other than that listed in the HPI Cardiovascular: negative other than that listed in the HPI Gastrointestinal: negative other than  that listed in the HPI Genitourinary: negative other than that listed in the HPI Integument: negative other than that listed in the HPI Hematologic: negative other than that listed in the HPI Musculoskeletal: negative other than that listed in the HPI Neurological: negative other than that listed in the HPI Allergy/Immunologic: negative other than that listed in the HPI    Objective:   Blood pressure 140/75, pulse 75, temperature 98.2 F (36.8 C), temperature source Oral, resp. rate 18, SpO2 93 %. There is no height or weight on file to calculate BMI.   Physical Exam:  General: Alert, interactive, in no acute distress. Obese female in power wheelchair.  Eyes: No conjunctival injection bilaterally, no discharge on the right, no discharge on the left and no Horner-Trantas dots present. PERRL bilaterally. EOMI without pain. No photophobia.  Ears: Right and left TM with holes but no active drainage.  Nose/Throat: External nose within normal limits and septum midline. Turbinates edematous and pale with clear discharge. Posterior oropharynx mildly erythematous without cobblestoning in the posterior oropharynx. Tonsils 2+ without exudates.  Tongue without thrush. Adenopathy: no enlarged lymph nodes appreciated in the anterior cervical, occipital, axillary, epitrochlear, inguinal, or popliteal regions. Lungs: Clear to auscultation without wheezing, rhonchi or rales. No increased work of breathing. CV: Normal S1/S2. No murmurs. Capillary refill <2 seconds.  Skin: Warm and dry, without lesions or rashes. Neuro:   Grossly intact. No focal deficits appreciated. Responsive to questions.  Diagnostic studies: none  Spirometry: results normal (FEV1: 1.43/85%, FVC: 2.20/108%, FEV1/FVC: 64%).    Spirometry consistent with mild obstructive disease, likely secondary to her body habitus.  Allergy Studies: none      Salvatore Marvel, MD [facility] [place]

## 2017-03-16 NOTE — Patient Instructions (Addendum)
1. Chronic non-allergic rhinitis - Continue with current loratadine 10mg  daily. - Continue with Flonase one spray per nostril daily. - Continue with Astelin one spray per nostril daily.  2. Moderate persistent asthma, uncomplicated - Lung function looked even better today. - We will not make any changes for now. - Daily controller medication(s): Breo 200/25 one inhalation once daily + prednisone 5mg  daily + Xolair monthly - Rescue medications: ProAir 4 puffs every 4-6 hours as needed, albuterol nebulizer one vial puffs every 4-6 hours as needed or DuoNeb nebulizer one vial every 4-6 hours as needed - Asthma control goals:  * Full participation in all desired activities (may need albuterol before activity) * Albuterol use two time or less a week on average (not counting use with activity) * Cough interfering with sleep two time or less a month * Oral steroids no more than once a year * No hospitalizations  3. Adverse food reaction (possibly scallops) - EpiPen is up to date.  4. Return in about 6 months (around 09/13/2017).    Please inform us of any Emergency Department visits, hospitalizations, or changes in symptoms. Call us before going to the ED for breathing or allergy symptoms since we might be able to fit you in for a sick visit. Feel free to contact us anytime with any questions, problems, or concerns.  It was a pleasure to see you again today! Enjoy the fall season!  Websites that have reliable patient information: 1. American Academy of Asthma, Allergy, and Immunology: www.aaaai.org 2. Food Allergy Research and Education (FARE): foodallergy.org 3. Mothers of Asthmatics: http://www.asthmacommunitynetwork.org 4. American College of Allergy, Asthma, and Immunology: www.acaai.org

## 2017-03-22 ENCOUNTER — Other Ambulatory Visit: Payer: Self-pay | Admitting: Allergy & Immunology

## 2017-03-22 MED ORDER — FLUTICASONE FUROATE-VILANTEROL 200-25 MCG/INH IN AEPB: 1.0000 | INHALATION_SPRAY | Freq: Every day | RESPIRATORY_TRACT | 3 refills | Status: DC

## 2017-03-22 NOTE — Telephone Encounter (Signed)
Received fax for Breo 200. Patient was last seen 03/16/2017. Refill sent in

## 2017-03-23 ENCOUNTER — Ambulatory Visit: Payer: Medicare HMO

## 2017-04-06 ENCOUNTER — Ambulatory Visit (INDEPENDENT_AMBULATORY_CARE_PROVIDER_SITE_OTHER): Payer: Medicare HMO

## 2017-04-06 DIAGNOSIS — J4540 Moderate persistent asthma, uncomplicated: Secondary | ICD-10-CM | POA: Diagnosis not present

## 2017-04-08 DIAGNOSIS — E786 Lipoprotein deficiency: Secondary | ICD-10-CM | POA: Diagnosis not present

## 2017-04-08 DIAGNOSIS — Z6841 Body Mass Index (BMI) 40.0 and over, adult: Secondary | ICD-10-CM | POA: Diagnosis not present

## 2017-04-08 DIAGNOSIS — R635 Abnormal weight gain: Secondary | ICD-10-CM | POA: Diagnosis not present

## 2017-04-08 DIAGNOSIS — I10 Essential (primary) hypertension: Secondary | ICD-10-CM | POA: Diagnosis not present

## 2017-04-08 DIAGNOSIS — M170 Bilateral primary osteoarthritis of knee: Secondary | ICD-10-CM | POA: Diagnosis not present

## 2017-04-22 DIAGNOSIS — I10 Essential (primary) hypertension: Secondary | ICD-10-CM | POA: Diagnosis not present

## 2017-04-22 DIAGNOSIS — Z6841 Body Mass Index (BMI) 40.0 and over, adult: Secondary | ICD-10-CM | POA: Diagnosis not present

## 2017-04-22 DIAGNOSIS — R790 Abnormal level of blood mineral: Secondary | ICD-10-CM | POA: Diagnosis not present

## 2017-04-28 DIAGNOSIS — E669 Obesity, unspecified: Secondary | ICD-10-CM | POA: Diagnosis not present

## 2017-04-28 DIAGNOSIS — Z96653 Presence of artificial knee joint, bilateral: Secondary | ICD-10-CM | POA: Diagnosis not present

## 2017-04-28 DIAGNOSIS — M545 Low back pain: Secondary | ICD-10-CM | POA: Diagnosis not present

## 2017-04-28 DIAGNOSIS — R5383 Other fatigue: Secondary | ICD-10-CM | POA: Diagnosis not present

## 2017-04-28 DIAGNOSIS — M6281 Muscle weakness (generalized): Secondary | ICD-10-CM | POA: Diagnosis not present

## 2017-04-28 DIAGNOSIS — G8929 Other chronic pain: Secondary | ICD-10-CM | POA: Diagnosis not present

## 2017-04-28 DIAGNOSIS — M170 Bilateral primary osteoarthritis of knee: Secondary | ICD-10-CM | POA: Diagnosis not present

## 2017-04-28 DIAGNOSIS — M25562 Pain in left knee: Secondary | ICD-10-CM | POA: Diagnosis not present

## 2017-04-28 DIAGNOSIS — R2689 Other abnormalities of gait and mobility: Secondary | ICD-10-CM | POA: Diagnosis not present

## 2017-04-28 DIAGNOSIS — M25561 Pain in right knee: Secondary | ICD-10-CM | POA: Diagnosis not present

## 2017-04-29 DIAGNOSIS — Z713 Dietary counseling and surveillance: Secondary | ICD-10-CM | POA: Diagnosis not present

## 2017-04-29 DIAGNOSIS — Z6841 Body Mass Index (BMI) 40.0 and over, adult: Secondary | ICD-10-CM | POA: Diagnosis not present

## 2017-05-04 ENCOUNTER — Ambulatory Visit (INDEPENDENT_AMBULATORY_CARE_PROVIDER_SITE_OTHER): Payer: Medicare HMO

## 2017-05-04 DIAGNOSIS — J4540 Moderate persistent asthma, uncomplicated: Secondary | ICD-10-CM

## 2017-05-05 DIAGNOSIS — R5383 Other fatigue: Secondary | ICD-10-CM | POA: Diagnosis not present

## 2017-05-05 DIAGNOSIS — R2689 Other abnormalities of gait and mobility: Secondary | ICD-10-CM | POA: Diagnosis not present

## 2017-05-05 DIAGNOSIS — Z96653 Presence of artificial knee joint, bilateral: Secondary | ICD-10-CM | POA: Diagnosis not present

## 2017-05-05 DIAGNOSIS — M25562 Pain in left knee: Secondary | ICD-10-CM | POA: Diagnosis not present

## 2017-05-05 DIAGNOSIS — M545 Low back pain: Secondary | ICD-10-CM | POA: Diagnosis not present

## 2017-05-05 DIAGNOSIS — M170 Bilateral primary osteoarthritis of knee: Secondary | ICD-10-CM | POA: Diagnosis not present

## 2017-05-05 DIAGNOSIS — M25561 Pain in right knee: Secondary | ICD-10-CM | POA: Diagnosis not present

## 2017-05-05 DIAGNOSIS — G8929 Other chronic pain: Secondary | ICD-10-CM | POA: Diagnosis not present

## 2017-05-05 DIAGNOSIS — M6281 Muscle weakness (generalized): Secondary | ICD-10-CM | POA: Diagnosis not present

## 2017-05-05 DIAGNOSIS — E669 Obesity, unspecified: Secondary | ICD-10-CM | POA: Diagnosis not present

## 2017-05-11 DIAGNOSIS — M170 Bilateral primary osteoarthritis of knee: Secondary | ICD-10-CM | POA: Diagnosis not present

## 2017-05-11 DIAGNOSIS — M25561 Pain in right knee: Secondary | ICD-10-CM | POA: Diagnosis not present

## 2017-05-11 DIAGNOSIS — R2689 Other abnormalities of gait and mobility: Secondary | ICD-10-CM | POA: Diagnosis not present

## 2017-05-11 DIAGNOSIS — M545 Low back pain: Secondary | ICD-10-CM | POA: Diagnosis not present

## 2017-05-11 DIAGNOSIS — Z96653 Presence of artificial knee joint, bilateral: Secondary | ICD-10-CM | POA: Diagnosis not present

## 2017-05-11 DIAGNOSIS — M25562 Pain in left knee: Secondary | ICD-10-CM | POA: Diagnosis not present

## 2017-05-11 DIAGNOSIS — E669 Obesity, unspecified: Secondary | ICD-10-CM | POA: Diagnosis not present

## 2017-05-11 DIAGNOSIS — R5383 Other fatigue: Secondary | ICD-10-CM | POA: Diagnosis not present

## 2017-05-11 DIAGNOSIS — M6281 Muscle weakness (generalized): Secondary | ICD-10-CM | POA: Diagnosis not present

## 2017-05-11 DIAGNOSIS — G8929 Other chronic pain: Secondary | ICD-10-CM | POA: Diagnosis not present

## 2017-05-13 DIAGNOSIS — Z6841 Body Mass Index (BMI) 40.0 and over, adult: Secondary | ICD-10-CM | POA: Diagnosis not present

## 2017-05-24 DIAGNOSIS — G4733 Obstructive sleep apnea (adult) (pediatric): Secondary | ICD-10-CM | POA: Diagnosis not present

## 2017-05-24 DIAGNOSIS — G6289 Other specified polyneuropathies: Secondary | ICD-10-CM | POA: Diagnosis not present

## 2017-05-24 DIAGNOSIS — K219 Gastro-esophageal reflux disease without esophagitis: Secondary | ICD-10-CM | POA: Diagnosis not present

## 2017-05-24 DIAGNOSIS — I10 Essential (primary) hypertension: Secondary | ICD-10-CM | POA: Diagnosis not present

## 2017-05-24 DIAGNOSIS — J4541 Moderate persistent asthma with (acute) exacerbation: Secondary | ICD-10-CM | POA: Diagnosis not present

## 2017-05-24 DIAGNOSIS — J301 Allergic rhinitis due to pollen: Secondary | ICD-10-CM | POA: Diagnosis not present

## 2017-05-24 DIAGNOSIS — R69 Illness, unspecified: Secondary | ICD-10-CM | POA: Diagnosis not present

## 2017-05-24 DIAGNOSIS — Z23 Encounter for immunization: Secondary | ICD-10-CM | POA: Diagnosis not present

## 2017-05-24 DIAGNOSIS — E782 Mixed hyperlipidemia: Secondary | ICD-10-CM | POA: Diagnosis not present

## 2017-05-24 DIAGNOSIS — Z0001 Encounter for general adult medical examination with abnormal findings: Secondary | ICD-10-CM | POA: Diagnosis not present

## 2017-05-26 DIAGNOSIS — Z7409 Other reduced mobility: Secondary | ICD-10-CM | POA: Diagnosis not present

## 2017-05-26 DIAGNOSIS — G8929 Other chronic pain: Secondary | ICD-10-CM | POA: Diagnosis not present

## 2017-05-26 DIAGNOSIS — M25562 Pain in left knee: Secondary | ICD-10-CM | POA: Diagnosis not present

## 2017-05-26 DIAGNOSIS — M25561 Pain in right knee: Secondary | ICD-10-CM | POA: Diagnosis not present

## 2017-05-26 DIAGNOSIS — M170 Bilateral primary osteoarthritis of knee: Secondary | ICD-10-CM | POA: Diagnosis not present

## 2017-05-26 DIAGNOSIS — M6281 Muscle weakness (generalized): Secondary | ICD-10-CM | POA: Diagnosis not present

## 2017-05-27 DIAGNOSIS — I10 Essential (primary) hypertension: Secondary | ICD-10-CM | POA: Diagnosis not present

## 2017-05-27 DIAGNOSIS — Z6841 Body Mass Index (BMI) 40.0 and over, adult: Secondary | ICD-10-CM | POA: Diagnosis not present

## 2017-06-01 ENCOUNTER — Ambulatory Visit (INDEPENDENT_AMBULATORY_CARE_PROVIDER_SITE_OTHER): Payer: Medicare HMO

## 2017-06-01 DIAGNOSIS — J4540 Moderate persistent asthma, uncomplicated: Secondary | ICD-10-CM

## 2017-06-02 DIAGNOSIS — M25561 Pain in right knee: Secondary | ICD-10-CM | POA: Diagnosis not present

## 2017-06-02 DIAGNOSIS — M170 Bilateral primary osteoarthritis of knee: Secondary | ICD-10-CM | POA: Diagnosis not present

## 2017-06-02 DIAGNOSIS — M25562 Pain in left knee: Secondary | ICD-10-CM | POA: Diagnosis not present

## 2017-06-02 DIAGNOSIS — G8929 Other chronic pain: Secondary | ICD-10-CM | POA: Diagnosis not present

## 2017-06-02 DIAGNOSIS — Z7409 Other reduced mobility: Secondary | ICD-10-CM | POA: Diagnosis not present

## 2017-06-02 DIAGNOSIS — M6281 Muscle weakness (generalized): Secondary | ICD-10-CM | POA: Diagnosis not present

## 2017-06-17 DIAGNOSIS — I10 Essential (primary) hypertension: Secondary | ICD-10-CM | POA: Diagnosis not present

## 2017-06-17 DIAGNOSIS — K219 Gastro-esophageal reflux disease without esophagitis: Secondary | ICD-10-CM | POA: Diagnosis not present

## 2017-06-17 DIAGNOSIS — Z9989 Dependence on other enabling machines and devices: Secondary | ICD-10-CM | POA: Diagnosis not present

## 2017-06-17 DIAGNOSIS — Z6841 Body Mass Index (BMI) 40.0 and over, adult: Secondary | ICD-10-CM | POA: Diagnosis not present

## 2017-06-17 DIAGNOSIS — E786 Lipoprotein deficiency: Secondary | ICD-10-CM | POA: Diagnosis not present

## 2017-06-17 DIAGNOSIS — M25562 Pain in left knee: Secondary | ICD-10-CM | POA: Diagnosis not present

## 2017-06-17 DIAGNOSIS — G8929 Other chronic pain: Secondary | ICD-10-CM | POA: Diagnosis not present

## 2017-06-17 DIAGNOSIS — G4733 Obstructive sleep apnea (adult) (pediatric): Secondary | ICD-10-CM | POA: Diagnosis not present

## 2017-06-17 DIAGNOSIS — M25561 Pain in right knee: Secondary | ICD-10-CM | POA: Diagnosis not present

## 2017-06-23 DIAGNOSIS — M25562 Pain in left knee: Secondary | ICD-10-CM | POA: Diagnosis not present

## 2017-06-23 DIAGNOSIS — M25561 Pain in right knee: Secondary | ICD-10-CM | POA: Diagnosis not present

## 2017-06-23 DIAGNOSIS — M6281 Muscle weakness (generalized): Secondary | ICD-10-CM | POA: Diagnosis not present

## 2017-06-23 DIAGNOSIS — G8929 Other chronic pain: Secondary | ICD-10-CM | POA: Diagnosis not present

## 2017-06-23 DIAGNOSIS — Z7409 Other reduced mobility: Secondary | ICD-10-CM | POA: Diagnosis not present

## 2017-06-23 DIAGNOSIS — M170 Bilateral primary osteoarthritis of knee: Secondary | ICD-10-CM | POA: Diagnosis not present

## 2017-06-28 DIAGNOSIS — Z6841 Body Mass Index (BMI) 40.0 and over, adult: Secondary | ICD-10-CM | POA: Diagnosis not present

## 2017-06-28 DIAGNOSIS — Z713 Dietary counseling and surveillance: Secondary | ICD-10-CM | POA: Diagnosis not present

## 2017-06-29 ENCOUNTER — Ambulatory Visit (INDEPENDENT_AMBULATORY_CARE_PROVIDER_SITE_OTHER): Payer: Medicare HMO | Admitting: *Deleted

## 2017-06-29 DIAGNOSIS — M170 Bilateral primary osteoarthritis of knee: Secondary | ICD-10-CM | POA: Diagnosis not present

## 2017-06-29 DIAGNOSIS — J4540 Moderate persistent asthma, uncomplicated: Secondary | ICD-10-CM

## 2017-06-29 DIAGNOSIS — M6281 Muscle weakness (generalized): Secondary | ICD-10-CM | POA: Diagnosis not present

## 2017-06-29 DIAGNOSIS — Z7409 Other reduced mobility: Secondary | ICD-10-CM | POA: Diagnosis not present

## 2017-06-29 DIAGNOSIS — M25561 Pain in right knee: Secondary | ICD-10-CM | POA: Diagnosis not present

## 2017-06-29 DIAGNOSIS — G8929 Other chronic pain: Secondary | ICD-10-CM | POA: Diagnosis not present

## 2017-06-29 DIAGNOSIS — M25562 Pain in left knee: Secondary | ICD-10-CM | POA: Diagnosis not present

## 2017-07-06 DIAGNOSIS — G8929 Other chronic pain: Secondary | ICD-10-CM | POA: Diagnosis not present

## 2017-07-06 DIAGNOSIS — M6281 Muscle weakness (generalized): Secondary | ICD-10-CM | POA: Diagnosis not present

## 2017-07-06 DIAGNOSIS — Z7409 Other reduced mobility: Secondary | ICD-10-CM | POA: Diagnosis not present

## 2017-07-06 DIAGNOSIS — M170 Bilateral primary osteoarthritis of knee: Secondary | ICD-10-CM | POA: Diagnosis not present

## 2017-07-06 DIAGNOSIS — M25562 Pain in left knee: Secondary | ICD-10-CM | POA: Diagnosis not present

## 2017-07-06 DIAGNOSIS — M25561 Pain in right knee: Secondary | ICD-10-CM | POA: Diagnosis not present

## 2017-07-13 DIAGNOSIS — M25561 Pain in right knee: Secondary | ICD-10-CM | POA: Diagnosis not present

## 2017-07-13 DIAGNOSIS — G8929 Other chronic pain: Secondary | ICD-10-CM | POA: Diagnosis not present

## 2017-07-13 DIAGNOSIS — M25562 Pain in left knee: Secondary | ICD-10-CM | POA: Diagnosis not present

## 2017-07-13 DIAGNOSIS — M170 Bilateral primary osteoarthritis of knee: Secondary | ICD-10-CM | POA: Diagnosis not present

## 2017-07-13 DIAGNOSIS — Z7409 Other reduced mobility: Secondary | ICD-10-CM | POA: Diagnosis not present

## 2017-07-13 DIAGNOSIS — M6281 Muscle weakness (generalized): Secondary | ICD-10-CM | POA: Diagnosis not present

## 2017-07-15 DIAGNOSIS — Z6841 Body Mass Index (BMI) 40.0 and over, adult: Secondary | ICD-10-CM | POA: Diagnosis not present

## 2017-07-15 DIAGNOSIS — I10 Essential (primary) hypertension: Secondary | ICD-10-CM | POA: Diagnosis not present

## 2017-07-26 DIAGNOSIS — Z713 Dietary counseling and surveillance: Secondary | ICD-10-CM | POA: Diagnosis not present

## 2017-07-26 DIAGNOSIS — Z6841 Body Mass Index (BMI) 40.0 and over, adult: Secondary | ICD-10-CM | POA: Diagnosis not present

## 2017-07-27 ENCOUNTER — Ambulatory Visit (INDEPENDENT_AMBULATORY_CARE_PROVIDER_SITE_OTHER): Payer: Medicare HMO | Admitting: *Deleted

## 2017-07-27 DIAGNOSIS — J4550 Severe persistent asthma, uncomplicated: Secondary | ICD-10-CM

## 2017-08-16 DIAGNOSIS — G4733 Obstructive sleep apnea (adult) (pediatric): Secondary | ICD-10-CM | POA: Diagnosis not present

## 2017-08-16 DIAGNOSIS — E1165 Type 2 diabetes mellitus with hyperglycemia: Secondary | ICD-10-CM | POA: Diagnosis not present

## 2017-08-16 DIAGNOSIS — J4541 Moderate persistent asthma with (acute) exacerbation: Secondary | ICD-10-CM | POA: Diagnosis not present

## 2017-08-16 DIAGNOSIS — Z6841 Body Mass Index (BMI) 40.0 and over, adult: Secondary | ICD-10-CM | POA: Diagnosis not present

## 2017-08-16 DIAGNOSIS — J301 Allergic rhinitis due to pollen: Secondary | ICD-10-CM | POA: Diagnosis not present

## 2017-08-17 DIAGNOSIS — R69 Illness, unspecified: Secondary | ICD-10-CM | POA: Diagnosis not present

## 2017-08-18 DIAGNOSIS — I10 Essential (primary) hypertension: Secondary | ICD-10-CM | POA: Diagnosis not present

## 2017-08-18 DIAGNOSIS — E782 Mixed hyperlipidemia: Secondary | ICD-10-CM | POA: Diagnosis not present

## 2017-08-18 DIAGNOSIS — E1165 Type 2 diabetes mellitus with hyperglycemia: Secondary | ICD-10-CM | POA: Diagnosis not present

## 2017-08-18 DIAGNOSIS — E876 Hypokalemia: Secondary | ICD-10-CM | POA: Diagnosis not present

## 2017-08-18 DIAGNOSIS — Z9189 Other specified personal risk factors, not elsewhere classified: Secondary | ICD-10-CM | POA: Diagnosis not present

## 2017-08-18 DIAGNOSIS — K210 Gastro-esophageal reflux disease with esophagitis: Secondary | ICD-10-CM | POA: Diagnosis not present

## 2017-08-18 DIAGNOSIS — I87332 Chronic venous hypertension (idiopathic) with ulcer and inflammation of left lower extremity: Secondary | ICD-10-CM | POA: Diagnosis not present

## 2017-08-19 DIAGNOSIS — Z1231 Encounter for screening mammogram for malignant neoplasm of breast: Secondary | ICD-10-CM | POA: Diagnosis not present

## 2017-08-23 DIAGNOSIS — E782 Mixed hyperlipidemia: Secondary | ICD-10-CM | POA: Diagnosis not present

## 2017-08-23 DIAGNOSIS — Z6841 Body Mass Index (BMI) 40.0 and over, adult: Secondary | ICD-10-CM | POA: Diagnosis not present

## 2017-08-23 DIAGNOSIS — G4733 Obstructive sleep apnea (adult) (pediatric): Secondary | ICD-10-CM | POA: Diagnosis not present

## 2017-08-23 DIAGNOSIS — M5136 Other intervertebral disc degeneration, lumbar region: Secondary | ICD-10-CM | POA: Diagnosis not present

## 2017-08-23 DIAGNOSIS — J4541 Moderate persistent asthma with (acute) exacerbation: Secondary | ICD-10-CM | POA: Diagnosis not present

## 2017-08-23 DIAGNOSIS — I10 Essential (primary) hypertension: Secondary | ICD-10-CM | POA: Diagnosis not present

## 2017-08-23 DIAGNOSIS — E876 Hypokalemia: Secondary | ICD-10-CM | POA: Diagnosis not present

## 2017-08-24 ENCOUNTER — Ambulatory Visit (INDEPENDENT_AMBULATORY_CARE_PROVIDER_SITE_OTHER): Payer: Medicare HMO | Admitting: *Deleted

## 2017-08-24 ENCOUNTER — Ambulatory Visit: Payer: Medicare HMO

## 2017-08-24 DIAGNOSIS — I10 Essential (primary) hypertension: Secondary | ICD-10-CM | POA: Diagnosis not present

## 2017-08-24 DIAGNOSIS — Z6841 Body Mass Index (BMI) 40.0 and over, adult: Secondary | ICD-10-CM | POA: Diagnosis not present

## 2017-08-24 DIAGNOSIS — J4550 Severe persistent asthma, uncomplicated: Secondary | ICD-10-CM | POA: Diagnosis not present

## 2017-09-06 DIAGNOSIS — Z713 Dietary counseling and surveillance: Secondary | ICD-10-CM | POA: Diagnosis not present

## 2017-09-06 DIAGNOSIS — Z6841 Body Mass Index (BMI) 40.0 and over, adult: Secondary | ICD-10-CM | POA: Diagnosis not present

## 2017-09-07 DIAGNOSIS — M25562 Pain in left knee: Secondary | ICD-10-CM | POA: Diagnosis not present

## 2017-09-07 DIAGNOSIS — Z6841 Body Mass Index (BMI) 40.0 and over, adult: Secondary | ICD-10-CM | POA: Diagnosis not present

## 2017-09-07 DIAGNOSIS — Z96653 Presence of artificial knee joint, bilateral: Secondary | ICD-10-CM | POA: Diagnosis not present

## 2017-09-07 DIAGNOSIS — M25561 Pain in right knee: Secondary | ICD-10-CM | POA: Diagnosis not present

## 2017-09-08 ENCOUNTER — Other Ambulatory Visit: Payer: Self-pay | Admitting: *Deleted

## 2017-09-08 MED ORDER — OMALIZUMAB 150 MG ~~LOC~~ SOLR: 300.0000 mg | SUBCUTANEOUS | 11 refills | Status: DC

## 2017-09-21 ENCOUNTER — Ambulatory Visit (INDEPENDENT_AMBULATORY_CARE_PROVIDER_SITE_OTHER): Payer: Medicare HMO | Admitting: Allergy & Immunology

## 2017-09-21 ENCOUNTER — Ambulatory Visit: Payer: Medicare HMO

## 2017-09-21 ENCOUNTER — Encounter: Payer: Self-pay | Admitting: Allergy & Immunology

## 2017-09-21 VITALS — BP 130/80 | HR 65 | Resp 17

## 2017-09-21 DIAGNOSIS — J4540 Moderate persistent asthma, uncomplicated: Secondary | ICD-10-CM

## 2017-09-21 DIAGNOSIS — Z6841 Body Mass Index (BMI) 40.0 and over, adult: Secondary | ICD-10-CM | POA: Diagnosis not present

## 2017-09-21 DIAGNOSIS — Z7952 Long term (current) use of systemic steroids: Secondary | ICD-10-CM

## 2017-09-21 DIAGNOSIS — I10 Essential (primary) hypertension: Secondary | ICD-10-CM | POA: Diagnosis not present

## 2017-09-21 DIAGNOSIS — T7802XD Anaphylactic reaction due to shellfish (crustaceans), subsequent encounter: Secondary | ICD-10-CM | POA: Diagnosis not present

## 2017-09-21 DIAGNOSIS — J300 Vasomotor rhinitis: Secondary | ICD-10-CM

## 2017-09-21 NOTE — Progress Notes (Signed)
FOLLOW UP  Date of Service/Encounter:  09/22/17   Assessment:   Moderate persistent asthma, uncomplicated  Chronic vasomotor rhinitis (with negative sIgE testing 2018)  Adverse food reaction (seafood)  On chronic prednisone therapy   Plan/Recommendations:   1. Chronic non-allergic rhinitis - Continue with current loratadine 10mg  daily. - Continue with Flonase one spray per nostril daily. - Continue with Astelin one spray per nostril daily.  2. Moderate persistent asthma, uncomplicated - Lung function looked worse today but it did improve with the albuterol treatment. - Start the prednisone burst today (stop your daily prednisone while on this burst). - We will not make any changes for now. - Daily controller medication(s): Breo 200/25 one inhalation once daily + prednisone 5mg  daily + Xolair monthly - Rescue medications: ProAir 4 puffs every 4-6 hours as needed, albuterol nebulizer one vial puffs every 4-6 hours as needed or DuoNeb nebulizer one vial every 4-6 hours as needed - Asthma control goals:  * Full participation in all desired activities (may need albuterol before activity) * Albuterol use two time or less a week on average (not counting use with activity) * Cough interfering with sleep two time or less a month * Oral steroids no more than once a year * No hospitalizations  3. Adverse food reaction (scallops) - EpiPen is up to date.  4. Return in about 4 months (around 01/21/2018).  Subjective:   [name] [name] is a 72 y.o. female presenting today for follow up of  Chief Complaint  Patient presents with  . Asthma    [name] [name] has a history of the following: Patient Active Problem List   Diagnosis Date Noted  . Chronic vasomotor rhinitis 09/22/2017  . Anaphylactic shock due to shellfish 09/22/2017  . On prednisone therapy 06/23/2016  . Moderate persistent asthma, uncomplicated 99/24/2683  . Chronic allergic rhinitis due to animal hair and  dander 06/23/2016  . Adverse food reaction 06/23/2016    History obtained from: chart review and patient.  [name] [name]'s Primary Care Provider is Caryl Bis, MD.     [name] [name] is a 72 y.o. female presenting for a follow up visit. She was last seen in November 2018. At that time, we continued her on Breo 200/25 one puff once daily as well as prednisone daily and Xolair monthly. She has been on prednisone for concurrent arthritis and we recommended continuing to take this. I recommended continued avoidance of seafood. I recommended continued use of Claritin 10mg  daily as well as Flonase one spray per nostril daily and Astelin one spray per nostril daily.  Since the last visit, she has done very well. She has actually been on an intensive weight loss program consisting of protein shakes during the majority of the day. She eats one meal around 7pm but otherwise drinks the shakes during the day. She has lost over 50 pounds. She has to lose so much weight to get her knee replacement performed.   Asthma/Respiratory Symptom History: She does continue with Breo 200/25 one puff once daily. She is changed to Ventolin which she does not use often at all. She does cough at night, which she thinks is secondary to the heat. [name] [name]'s asthma has been well controlled. She has not required rescue medication, experienced nocturnal awakenings due to lower respiratory symptoms, nor have activities of daily living been limited. She has required no Emergency Department or Urgent Care visits for her asthma. She has required zero courses of systemic steroids for asthma  exacerbations since the last visit. ACT score today is 16, indicating subpar asthma symptom control.   Allergic Rhinitis Symptom History:  This seems well controlled with the use of fluticasone as well as astelin and loratadine.   Food Allergy Symptom History: She is avoiding scallops. EpiPen is up to date. She is not touching anything that she thinks  might cause a reaction.    Otherwise, there have been no changes to her past medical history, surgical history, family history, or social history.    Review of Systems: a 14-point review of systems is pertinent for what is mentioned in HPI.  Otherwise, all other systems were negative. Constitutional: negative other than that listed in the HPI Eyes: negative other than that listed in the HPI Ears, nose, mouth, throat, and face: negative other than that listed in the HPI Respiratory: negative other than that listed in the HPI Cardiovascular: negative other than that listed in the HPI Gastrointestinal: negative other than that listed in the HPI Genitourinary: negative other than that listed in the HPI Integument: negative other than that listed in the HPI Hematologic: negative other than that listed in the HPI Musculoskeletal: negative other than that listed in the HPI Neurological: negative other than that listed in the HPI Allergy/Immunologic: negative other than that listed in the HPI    Objective:   Blood pressure 130/80, pulse 65, resp. rate 17, SpO2 93 %. There is no height or weight on file to calculate BMI.   Physical Exam:  General: Alert, interactive, in no acute distress. Obese female but clearly losing weight from previous exams.  Eyes: No conjunctival injection bilaterally, no discharge on the right, no discharge on the left and no Horner-Trantas dots present. PERRL bilaterally. EOMI without pain. No photophobia.  Ears: Right TM pearly gray with normal light reflex, Left TM pearly gray with normal light reflex, Right TM intact without perforation and Left TM intact without perforation.  Nose/Throat: External nose within normal limits and septum midline. Turbinates edematous and pale with clear discharge. Posterior oropharynx erythematous with cobblestoning in the posterior oropharynx. Tonsils 2+ without exudates.  Tongue without thrush. Lungs: Clear to auscultation  without wheezing, rhonchi or rales. No increased work of breathing. CV: Normal S1/S2. No murmurs. Capillary refill <2 seconds.  Skin: Warm and dry, without lesions or rashes. Neuro:   Grossly intact. No focal deficits appreciated. Responsive to questions.  Diagnostic studies:   Spirometry: results abnormal (FEV1: 0.94/59%, FVC: 1.36/60%, FEV1/FVC: 69%).    Spirometry consistent with possible restrictive disease. Albuterol/Atrovent nebulizer treatment given in clinic with significant improvement in FEV1 per ATS criteria.  Allergy Studies: none    Salvatore Marvel, MD  [facility] [place]

## 2017-09-21 NOTE — Patient Instructions (Addendum)
1. Chronic non-allergic rhinitis - Continue with current loratadine 10mg  daily. - Continue with Flonase one spray per nostril daily. - Continue with Astelin one spray per nostril daily.  2. Moderate persistent asthma, uncomplicated - Lung function looked worse today but it did improve with the albuterol treatment. - Start the prednisone burst today (stop your daily prednisone while on this burst). - We will not make any changes for now. - Daily controller medication(s): Breo 200/25 one inhalation once daily + prednisone 5mg  daily + Xolair monthly - Rescue medications: ProAir 4 puffs every 4-6 hours as needed, albuterol nebulizer one vial puffs every 4-6 hours as needed or DuoNeb nebulizer one vial every 4-6 hours as needed - Asthma control goals:  * Full participation in all desired activities (may need albuterol before activity) * Albuterol use two time or less a week on average (not counting use with activity) * Cough interfering with sleep two time or less a month * Oral steroids no more than once a year * No hospitalizations  3. Adverse food reaction (scallops) - EpiPen is up to date.  4. Return in about 4 months (around 01/21/2018).   Please inform us of any Emergency Department visits, hospitalizations, or changes in symptoms. Call us before going to the ED for breathing or allergy symptoms since we might be able to fit you in for a sick visit. Feel free to contact us anytime with any questions, problems, or concerns.  It was a pleasure to see you again today!  Websites that have reliable patient information: 1. American Academy of Asthma, Allergy, and Immunology: www.aaaai.org 2. Food Allergy Research and Education (FARE): foodallergy.org 3. Mothers of Asthmatics: http://www.asthmacommunitynetwork.org 4. American College of Allergy, Asthma, and Immunology: MonthlyElectricBill.co.uk   Make sure you are registered to vote!

## 2017-09-22 ENCOUNTER — Encounter: Payer: Self-pay | Admitting: Allergy & Immunology

## 2017-09-22 DIAGNOSIS — J300 Vasomotor rhinitis: Secondary | ICD-10-CM | POA: Insufficient documentation

## 2017-09-22 DIAGNOSIS — T7802XA Anaphylactic reaction due to shellfish (crustaceans), initial encounter: Secondary | ICD-10-CM | POA: Insufficient documentation

## 2017-10-04 DIAGNOSIS — R635 Abnormal weight gain: Secondary | ICD-10-CM | POA: Diagnosis not present

## 2017-10-19 ENCOUNTER — Ambulatory Visit (INDEPENDENT_AMBULATORY_CARE_PROVIDER_SITE_OTHER): Payer: Medicare HMO | Admitting: *Deleted

## 2017-10-19 DIAGNOSIS — J4540 Moderate persistent asthma, uncomplicated: Secondary | ICD-10-CM

## 2017-10-26 DIAGNOSIS — Z6841 Body Mass Index (BMI) 40.0 and over, adult: Secondary | ICD-10-CM | POA: Diagnosis not present

## 2017-10-26 DIAGNOSIS — I10 Essential (primary) hypertension: Secondary | ICD-10-CM | POA: Diagnosis not present

## 2017-11-09 DIAGNOSIS — I10 Essential (primary) hypertension: Secondary | ICD-10-CM | POA: Diagnosis not present

## 2017-11-09 DIAGNOSIS — Z6841 Body Mass Index (BMI) 40.0 and over, adult: Secondary | ICD-10-CM | POA: Diagnosis not present

## 2017-11-16 ENCOUNTER — Ambulatory Visit (INDEPENDENT_AMBULATORY_CARE_PROVIDER_SITE_OTHER): Payer: Medicare HMO

## 2017-11-16 DIAGNOSIS — J4540 Moderate persistent asthma, uncomplicated: Secondary | ICD-10-CM | POA: Diagnosis not present

## 2017-11-16 DIAGNOSIS — I10 Essential (primary) hypertension: Secondary | ICD-10-CM | POA: Diagnosis not present

## 2017-11-16 DIAGNOSIS — M545 Low back pain: Secondary | ICD-10-CM | POA: Diagnosis not present

## 2017-11-16 DIAGNOSIS — K219 Gastro-esophageal reflux disease without esophagitis: Secondary | ICD-10-CM | POA: Diagnosis not present

## 2017-11-19 DIAGNOSIS — G4733 Obstructive sleep apnea (adult) (pediatric): Secondary | ICD-10-CM | POA: Diagnosis not present

## 2017-11-19 DIAGNOSIS — M5136 Other intervertebral disc degeneration, lumbar region: Secondary | ICD-10-CM | POA: Diagnosis not present

## 2017-11-19 DIAGNOSIS — Z6841 Body Mass Index (BMI) 40.0 and over, adult: Secondary | ICD-10-CM | POA: Diagnosis not present

## 2017-11-19 DIAGNOSIS — E039 Hypothyroidism, unspecified: Secondary | ICD-10-CM | POA: Diagnosis not present

## 2017-11-19 DIAGNOSIS — E876 Hypokalemia: Secondary | ICD-10-CM | POA: Diagnosis not present

## 2017-11-19 DIAGNOSIS — R739 Hyperglycemia, unspecified: Secondary | ICD-10-CM | POA: Diagnosis not present

## 2017-11-19 DIAGNOSIS — J4541 Moderate persistent asthma with (acute) exacerbation: Secondary | ICD-10-CM | POA: Diagnosis not present

## 2017-11-19 DIAGNOSIS — D509 Iron deficiency anemia, unspecified: Secondary | ICD-10-CM | POA: Diagnosis not present

## 2017-11-19 DIAGNOSIS — Z9189 Other specified personal risk factors, not elsewhere classified: Secondary | ICD-10-CM | POA: Diagnosis not present

## 2017-11-19 DIAGNOSIS — I872 Venous insufficiency (chronic) (peripheral): Secondary | ICD-10-CM | POA: Diagnosis not present

## 2017-11-19 DIAGNOSIS — Z1389 Encounter for screening for other disorder: Secondary | ICD-10-CM | POA: Diagnosis not present

## 2017-11-19 DIAGNOSIS — R69 Illness, unspecified: Secondary | ICD-10-CM | POA: Diagnosis not present

## 2017-11-19 DIAGNOSIS — K219 Gastro-esophageal reflux disease without esophagitis: Secondary | ICD-10-CM | POA: Diagnosis not present

## 2017-11-19 DIAGNOSIS — G6289 Other specified polyneuropathies: Secondary | ICD-10-CM | POA: Diagnosis not present

## 2017-11-19 DIAGNOSIS — I10 Essential (primary) hypertension: Secondary | ICD-10-CM | POA: Diagnosis not present

## 2017-11-19 DIAGNOSIS — J301 Allergic rhinitis due to pollen: Secondary | ICD-10-CM | POA: Diagnosis not present

## 2017-11-19 DIAGNOSIS — E782 Mixed hyperlipidemia: Secondary | ICD-10-CM | POA: Diagnosis not present

## 2017-11-19 DIAGNOSIS — R7301 Impaired fasting glucose: Secondary | ICD-10-CM | POA: Diagnosis not present

## 2017-11-25 DIAGNOSIS — G8929 Other chronic pain: Secondary | ICD-10-CM | POA: Diagnosis not present

## 2017-11-25 DIAGNOSIS — M25562 Pain in left knee: Secondary | ICD-10-CM | POA: Diagnosis not present

## 2017-11-25 DIAGNOSIS — M25561 Pain in right knee: Secondary | ICD-10-CM | POA: Diagnosis not present

## 2017-11-25 DIAGNOSIS — I10 Essential (primary) hypertension: Secondary | ICD-10-CM | POA: Diagnosis not present

## 2017-11-25 DIAGNOSIS — Z6841 Body Mass Index (BMI) 40.0 and over, adult: Secondary | ICD-10-CM | POA: Diagnosis not present

## 2017-11-29 DIAGNOSIS — Z713 Dietary counseling and surveillance: Secondary | ICD-10-CM | POA: Diagnosis not present

## 2017-11-29 DIAGNOSIS — Z6841 Body Mass Index (BMI) 40.0 and over, adult: Secondary | ICD-10-CM | POA: Diagnosis not present

## 2017-12-14 ENCOUNTER — Ambulatory Visit (INDEPENDENT_AMBULATORY_CARE_PROVIDER_SITE_OTHER): Payer: Medicare HMO | Admitting: *Deleted

## 2017-12-14 DIAGNOSIS — J4540 Moderate persistent asthma, uncomplicated: Secondary | ICD-10-CM

## 2017-12-16 DIAGNOSIS — I10 Essential (primary) hypertension: Secondary | ICD-10-CM | POA: Diagnosis not present

## 2017-12-16 DIAGNOSIS — Z6841 Body Mass Index (BMI) 40.0 and over, adult: Secondary | ICD-10-CM | POA: Diagnosis not present

## 2017-12-27 DIAGNOSIS — Z713 Dietary counseling and surveillance: Secondary | ICD-10-CM | POA: Diagnosis not present

## 2017-12-27 DIAGNOSIS — Z6841 Body Mass Index (BMI) 40.0 and over, adult: Secondary | ICD-10-CM | POA: Diagnosis not present

## 2018-01-06 DIAGNOSIS — Z6841 Body Mass Index (BMI) 40.0 and over, adult: Secondary | ICD-10-CM | POA: Diagnosis not present

## 2018-01-06 DIAGNOSIS — M25511 Pain in right shoulder: Secondary | ICD-10-CM | POA: Diagnosis not present

## 2018-01-06 DIAGNOSIS — M19011 Primary osteoarthritis, right shoulder: Secondary | ICD-10-CM | POA: Diagnosis not present

## 2018-01-11 ENCOUNTER — Ambulatory Visit (INDEPENDENT_AMBULATORY_CARE_PROVIDER_SITE_OTHER): Payer: Medicare HMO

## 2018-01-11 DIAGNOSIS — J4540 Moderate persistent asthma, uncomplicated: Secondary | ICD-10-CM

## 2018-01-11 DIAGNOSIS — G8929 Other chronic pain: Secondary | ICD-10-CM | POA: Diagnosis not present

## 2018-01-11 DIAGNOSIS — M25561 Pain in right knee: Secondary | ICD-10-CM | POA: Diagnosis not present

## 2018-01-11 DIAGNOSIS — Z6841 Body Mass Index (BMI) 40.0 and over, adult: Secondary | ICD-10-CM | POA: Diagnosis not present

## 2018-01-11 DIAGNOSIS — M25562 Pain in left knee: Secondary | ICD-10-CM | POA: Diagnosis not present

## 2018-01-11 DIAGNOSIS — I10 Essential (primary) hypertension: Secondary | ICD-10-CM | POA: Diagnosis not present

## 2018-01-21 DIAGNOSIS — M25511 Pain in right shoulder: Secondary | ICD-10-CM | POA: Diagnosis not present

## 2018-01-24 DIAGNOSIS — Z713 Dietary counseling and surveillance: Secondary | ICD-10-CM | POA: Diagnosis not present

## 2018-01-24 DIAGNOSIS — Z6841 Body Mass Index (BMI) 40.0 and over, adult: Secondary | ICD-10-CM | POA: Diagnosis not present

## 2018-01-25 ENCOUNTER — Encounter: Payer: Self-pay | Admitting: Allergy & Immunology

## 2018-01-25 ENCOUNTER — Ambulatory Visit: Payer: Medicare HMO | Admitting: Allergy & Immunology

## 2018-01-25 ENCOUNTER — Ambulatory Visit: Payer: Self-pay

## 2018-01-25 VITALS — BP 128/68 | HR 66 | Temp 98.5°F | Resp 18

## 2018-01-25 DIAGNOSIS — J4550 Severe persistent asthma, uncomplicated: Secondary | ICD-10-CM

## 2018-01-25 DIAGNOSIS — J300 Vasomotor rhinitis: Secondary | ICD-10-CM

## 2018-01-25 DIAGNOSIS — T7802XD Anaphylactic reaction due to shellfish (crustaceans), subsequent encounter: Secondary | ICD-10-CM

## 2018-01-25 NOTE — Patient Instructions (Addendum)
1. Chronic non-allergic rhinitis - Continue with current loratadine 10mg  daily. - Continue with Flonase one spray per nostril daily. - Continue with Astelin one spray per nostril daily.  2. Moderate persistent asthma, uncomplicated - Lung testing looks GREAT today!  - We will not make any medication changes at this time.  - Daily controller medication(s): Breo 200/25 one inhalation once daily + prednisone 5mg  daily + Xolair monthly - Rescue medications: ProAir 4 puffs every 4-6 hours as needed, albuterol nebulizer one vial puffs every 4-6 hours as needed or DuoNeb nebulizer one vial every 4-6 hours as needed - Asthma control goals:  * Full participation in all desired activities (may need albuterol before activity) * Albuterol use two time or less a week on average (not counting use with activity) * Cough interfering with sleep two time or less a month * Oral steroids no more than once a year * No hospitalizations  3. Adverse food reaction (selected seafood) - EpiPen is up to date.  4. Return in about 6 months (around 07/27/2018).   Please inform us of any Emergency Department visits, hospitalizations, or changes in symptoms. Call us before going to the ED for breathing or allergy symptoms since we might be able to fit you in for a sick visit. Feel free to contact us anytime with any questions, problems, or concerns.  It was a pleasure to see you again today! Good luck with the shoulder replacement!   Websites that have reliable patient information: 1. American Academy of Asthma, Allergy, and Immunology: www.aaaai.org 2. Food Allergy Research and Education (FARE): foodallergy.org 3. Mothers of Asthmatics: http://www.asthmacommunitynetwork.org 4. American College of Allergy, Asthma, and Immunology: MonthlyElectricBill.co.uk   Make sure you are registered to vote!

## 2018-01-25 NOTE — Progress Notes (Signed)
FOLLOW UP  Date of Service/Encounter:  01/25/18   Assessment:   Moderate persistent asthma, uncomplicated  Chronic vasomotor rhinitis (with negative sIgE testing 2018)  Adverse food reaction(seafood)  On chronic prednisone therapy - due to arthritis  Plan/Recommendations:   1. Chronic non-allergic rhinitis - Continue with current loratadine 10mg  daily. - Continue with Flonase one spray per nostril daily. - Continue with Astelin one spray per nostril daily.  2. Moderate persistent asthma, uncomplicated - Lung testing looks GREAT today!  - We will not make any medication changes at this time.  - Daily controller medication(s): Breo 200/25 one inhalation once daily + prednisone 5mg  daily + Xolair monthly - Rescue medications: ProAir 4 puffs every 4-6 hours as needed, albuterol nebulizer one vial puffs every 4-6 hours as needed or DuoNeb nebulizer one vial every 4-6 hours as needed - Asthma control goals:  * Full participation in all desired activities (may need albuterol before activity) * Albuterol use two time or less a week on average (not counting use with activity) * Cough interfering with sleep two time or less a month * Oral steroids no more than once a year * No hospitalizations  3. Adverse food reaction (selected seafood) - EpiPen is up to date.  4. Return in about 6 months (around 07/27/2018).   Subjective:   [name] [name] is a 72 y.o. female presenting today for follow up of  Chief Complaint  Patient presents with  . Asthma  . Allergic Rhinitis     [name] [name] has a history of the following: Patient Active Problem List   Diagnosis Date Noted  . Chronic vasomotor rhinitis 09/22/2017  . Anaphylactic shock due to shellfish 09/22/2017  . On prednisone therapy 06/23/2016  . Moderate persistent asthma, uncomplicated 27/74/1287  . Chronic allergic rhinitis due to animal hair and dander 06/23/2016  . Adverse food reaction 06/23/2016    History  obtained from: chart review and patient.  [name] [name]'s Primary Care Provider is Caryl Bis, MD.     [name] [name] is a 72 y.o. female presenting for a follow up visit.  She was last seen in June 2019.  At that time, her lung function looked worse, but it did improve with albuterol.  We started her on a prednisone burst.  We continued her on Breo 200/25 mcg 1 puff once daily as well as prednisone 5 mg daily and Xolair monthly.  For her rhinitis, we continue loratadine 10 mg daily as well as Flonase and Astelin.  We recommended continued avoidance of scallops.  Since the last visit, [name] [name] has done very well. She continues to work on losing weight in anticipation for her bilateral knee surgery. She also started having right shoulder pain and it turns out that she needs a new right shoulder joint as well.   Asthma/Respiratory Symptom History: She remains on the Breo one puff once daily as well as the Xolair. She is on prednisone 5mg  daily, prescribed by her PCP. [name] [name]'s asthma has been well controlled. She has not required rescue medication, experienced nocturnal awakenings due to lower respiratory symptoms, nor have activities of daily living been limited. She has required no Emergency Department or Urgent Care visits for her asthma. She has required zero courses of systemic steroids for asthma exacerbations since the last visit. ACT score today is 23, indicating excellent asthma symptom control.   Allergic Rhinitis Symptom History: She is using her nasal sprays as well as her antihistamine. She has not needed  any antibiotics at all for sinusitis. She has used nasal saline rinses intermittently as needed.    She continues to avoid scallops as well as shrimp. She does tolerate salmon - but only from a can. She can eat oysters, crab, and lobster. Otherwise, there have been no changes to her past medical history, surgical history, family history, or social history.    Review of Systems: a 14-point  review of systems is pertinent for what is mentioned in HPI.  Otherwise, all other systems were negative.  Constitutional: negative other than that listed in the HPI Eyes: negative other than that listed in the HPI Ears, nose, mouth, throat, and face: negative other than that listed in the HPI Respiratory: negative other than that listed in the HPI Cardiovascular: negative other than that listed in the HPI Gastrointestinal: negative other than that listed in the HPI Genitourinary: negative other than that listed in the HPI Integument: negative other than that listed in the HPI Hematologic: negative other than that listed in the HPI Musculoskeletal: negative other than that listed in the HPI Neurological: negative other than that listed in the HPI Allergy/Immunologic: negative other than that listed in the HPI    Objective:   Blood pressure 128/68, pulse 66, temperature 98.5 F (36.9 C), temperature source Oral, resp. rate 18, SpO2 95 %. There is no height or weight on file to calculate BMI.   Physical Exam:  General: Alert, interactive, in no acute distress. Obese. Smiling.  Eyes: No conjunctival injection bilaterally, no discharge on the right, no discharge on the left and no Horner-Trantas dots present. PERRL bilaterally. EOMI without pain. No photophobia.  Ears: Right TM pearly gray with normal light reflex, Left TM pearly gray with normal light reflex, Right TM intact without perforation and Left TM intact without perforation.  Nose/Throat: External nose within normal limits and septum midline. Turbinates edematous without discharge. Posterior oropharynx mildly erythematous without cobblestoning in the posterior oropharynx. Tonsils 2+ without exudates.  Tongue without thrush. Lungs: Clear to auscultation without wheezing, rhonchi or rales. No increased work of breathing. CV: Normal S1/S2. No murmurs. Capillary refill <2 seconds.  Skin: Warm and dry, without lesions or  rashes. Neuro:   Grossly intact. No focal deficits appreciated. Responsive to questions.  Diagnostic studies:   Spirometry: results normal (FEV1: 1.16/73%, FVC: 1.66/73%, FEV1/FVC: 69%).    Spirometry consistent with possible restrictive disease. Stable compared to previous spirometric findings.   Allergy Studies: none     Salvatore Marvel, MD  [facility] [place] [facility] [place]

## 2018-02-04 ENCOUNTER — Other Ambulatory Visit: Payer: Self-pay | Admitting: Orthopedic Surgery

## 2018-02-04 DIAGNOSIS — S46011D Strain of muscle(s) and tendon(s) of the rotator cuff of right shoulder, subsequent encounter: Secondary | ICD-10-CM | POA: Diagnosis not present

## 2018-02-04 DIAGNOSIS — M25511 Pain in right shoulder: Secondary | ICD-10-CM

## 2018-02-04 DIAGNOSIS — M19011 Primary osteoarthritis, right shoulder: Secondary | ICD-10-CM | POA: Diagnosis not present

## 2018-02-08 DIAGNOSIS — E669 Obesity, unspecified: Secondary | ICD-10-CM | POA: Diagnosis not present

## 2018-02-09 ENCOUNTER — Ambulatory Visit (INDEPENDENT_AMBULATORY_CARE_PROVIDER_SITE_OTHER): Payer: Medicare HMO

## 2018-02-09 DIAGNOSIS — J4540 Moderate persistent asthma, uncomplicated: Secondary | ICD-10-CM | POA: Diagnosis not present

## 2018-02-12 ENCOUNTER — Ambulatory Visit
Admission: RE | Admit: 2018-02-12 | Discharge: 2018-02-12 | Disposition: A | Discharge status: Home / Self Care | Payer: Medicare HMO | Source: Ambulatory Visit | Attending: Orthopedic Surgery | Admitting: Orthopedic Surgery

## 2018-02-12 DIAGNOSIS — M25511 Pain in right shoulder: Secondary | ICD-10-CM

## 2018-02-15 DIAGNOSIS — M19011 Primary osteoarthritis, right shoulder: Secondary | ICD-10-CM | POA: Diagnosis not present

## 2018-02-16 DIAGNOSIS — G4733 Obstructive sleep apnea (adult) (pediatric): Secondary | ICD-10-CM | POA: Diagnosis not present

## 2018-02-16 DIAGNOSIS — G252 Other specified forms of tremor: Secondary | ICD-10-CM | POA: Diagnosis not present

## 2018-02-16 DIAGNOSIS — I10 Essential (primary) hypertension: Secondary | ICD-10-CM | POA: Diagnosis not present

## 2018-02-16 DIAGNOSIS — R69 Illness, unspecified: Secondary | ICD-10-CM | POA: Diagnosis not present

## 2018-02-16 DIAGNOSIS — Z6841 Body Mass Index (BMI) 40.0 and over, adult: Secondary | ICD-10-CM | POA: Diagnosis not present

## 2018-02-16 DIAGNOSIS — E782 Mixed hyperlipidemia: Secondary | ICD-10-CM | POA: Diagnosis not present

## 2018-02-18 ENCOUNTER — Other Ambulatory Visit: Payer: Self-pay | Admitting: Orthopedic Surgery

## 2018-02-18 DIAGNOSIS — M25511 Pain in right shoulder: Secondary | ICD-10-CM

## 2018-02-22 DIAGNOSIS — Z23 Encounter for immunization: Secondary | ICD-10-CM | POA: Diagnosis not present

## 2018-02-28 ENCOUNTER — Encounter: Payer: Self-pay | Admitting: Radiology

## 2018-02-28 ENCOUNTER — Ambulatory Visit
Admission: RE | Admit: 2018-02-28 | Discharge: 2018-02-28 | Disposition: A | Discharge status: Home / Self Care | Payer: Medicare HMO | Source: Ambulatory Visit | Attending: Orthopedic Surgery | Admitting: Orthopedic Surgery

## 2018-02-28 DIAGNOSIS — M75111 Incomplete rotator cuff tear or rupture of right shoulder, not specified as traumatic: Secondary | ICD-10-CM | POA: Diagnosis not present

## 2018-02-28 DIAGNOSIS — M25511 Pain in right shoulder: Secondary | ICD-10-CM

## 2018-03-02 ENCOUNTER — Other Ambulatory Visit: Payer: Self-pay | Admitting: Orthopedic Surgery

## 2018-03-02 DIAGNOSIS — M25511 Pain in right shoulder: Secondary | ICD-10-CM

## 2018-03-07 DIAGNOSIS — Z6841 Body Mass Index (BMI) 40.0 and over, adult: Secondary | ICD-10-CM | POA: Diagnosis not present

## 2018-03-07 DIAGNOSIS — L03113 Cellulitis of right upper limb: Secondary | ICD-10-CM | POA: Diagnosis not present

## 2018-03-09 ENCOUNTER — Ambulatory Visit (INDEPENDENT_AMBULATORY_CARE_PROVIDER_SITE_OTHER): Payer: Medicare HMO | Admitting: *Deleted

## 2018-03-09 DIAGNOSIS — J4540 Moderate persistent asthma, uncomplicated: Secondary | ICD-10-CM | POA: Diagnosis not present

## 2018-03-15 ENCOUNTER — Other Ambulatory Visit: Payer: Medicare HMO

## 2018-03-15 ENCOUNTER — Ambulatory Visit
Admission: RE | Admit: 2018-03-15 | Discharge: 2018-03-15 | Disposition: A | Discharge status: Home / Self Care | Payer: Medicare HMO | Source: Ambulatory Visit | Attending: Orthopedic Surgery | Admitting: Orthopedic Surgery

## 2018-03-15 DIAGNOSIS — M25511 Pain in right shoulder: Secondary | ICD-10-CM

## 2018-03-15 DIAGNOSIS — M7989 Other specified soft tissue disorders: Secondary | ICD-10-CM | POA: Diagnosis not present

## 2018-03-15 MED ORDER — GADOBENATE DIMEGLUMINE 529 MG/ML IV SOLN
20.0000 mL | Freq: Once | INTRAVENOUS | Status: AC | PRN
  Administered 2018-03-15: 20 mL via INTRAVENOUS

## 2018-03-29 DIAGNOSIS — Z9011 Acquired absence of right breast and nipple: Secondary | ICD-10-CM | POA: Diagnosis not present

## 2018-03-29 DIAGNOSIS — Z923 Personal history of irradiation: Secondary | ICD-10-CM | POA: Diagnosis not present

## 2018-03-29 DIAGNOSIS — D492 Neoplasm of unspecified behavior of bone, soft tissue, and skin: Secondary | ICD-10-CM | POA: Diagnosis not present

## 2018-03-29 DIAGNOSIS — D2111 Benign neoplasm of connective and other soft tissue of right upper limb, including shoulder: Secondary | ICD-10-CM | POA: Diagnosis not present

## 2018-03-29 DIAGNOSIS — Z87891 Personal history of nicotine dependence: Secondary | ICD-10-CM | POA: Diagnosis not present

## 2018-03-29 DIAGNOSIS — Z9221 Personal history of antineoplastic chemotherapy: Secondary | ICD-10-CM | POA: Diagnosis not present

## 2018-03-29 DIAGNOSIS — Z853 Personal history of malignant neoplasm of breast: Secondary | ICD-10-CM | POA: Diagnosis not present

## 2018-04-01 DIAGNOSIS — D492 Neoplasm of unspecified behavior of bone, soft tissue, and skin: Secondary | ICD-10-CM | POA: Diagnosis not present

## 2018-04-01 DIAGNOSIS — R911 Solitary pulmonary nodule: Secondary | ICD-10-CM | POA: Diagnosis not present

## 2018-04-04 DIAGNOSIS — C761 Malignant neoplasm of thorax: Secondary | ICD-10-CM | POA: Diagnosis not present

## 2018-04-04 DIAGNOSIS — C493 Malignant neoplasm of connective and soft tissue of thorax: Secondary | ICD-10-CM | POA: Diagnosis not present

## 2018-04-04 DIAGNOSIS — D492 Neoplasm of unspecified behavior of bone, soft tissue, and skin: Secondary | ICD-10-CM | POA: Diagnosis not present

## 2018-04-04 DIAGNOSIS — M7591 Shoulder lesion, unspecified, right shoulder: Secondary | ICD-10-CM | POA: Diagnosis not present

## 2018-04-04 DIAGNOSIS — R222 Localized swelling, mass and lump, trunk: Secondary | ICD-10-CM | POA: Diagnosis not present

## 2018-04-07 ENCOUNTER — Telehealth: Payer: Self-pay | Admitting: *Deleted

## 2018-04-07 NOTE — Telephone Encounter (Signed)
Patient was recently diagnosed  with cancer. They have not determined type yet. She want to know if she can still come in and get her Xolair injection. Please advice.

## 2018-04-07 NOTE — Telephone Encounter (Signed)
Called and spoke with patient.  [name] [name] states she is waiting for biopsy results that were done on a nodule on her right shoulder.  She states she was told it was cancer and would need surgery at [facility] in the future.  Patient is feeling well and will keep her appointment for her injection on 04/08/18 at this time.

## 2018-04-07 NOTE — Telephone Encounter (Signed)
That is terrible to hear. She can continue to get her Xolair since it is such a targeted therapy.  Salvatore Marvel, MD [facility] [place] [facility] [place]

## 2018-04-08 ENCOUNTER — Ambulatory Visit (INDEPENDENT_AMBULATORY_CARE_PROVIDER_SITE_OTHER): Payer: Medicare HMO

## 2018-04-08 DIAGNOSIS — J4540 Moderate persistent asthma, uncomplicated: Secondary | ICD-10-CM | POA: Diagnosis not present

## 2018-04-15 DIAGNOSIS — R911 Solitary pulmonary nodule: Secondary | ICD-10-CM | POA: Diagnosis not present

## 2018-04-15 DIAGNOSIS — C499 Malignant neoplasm of connective and soft tissue, unspecified: Secondary | ICD-10-CM | POA: Diagnosis not present

## 2018-04-18 DIAGNOSIS — Z885 Allergy status to narcotic agent status: Secondary | ICD-10-CM | POA: Diagnosis not present

## 2018-04-18 DIAGNOSIS — C7800 Secondary malignant neoplasm of unspecified lung: Secondary | ICD-10-CM | POA: Diagnosis not present

## 2018-04-18 DIAGNOSIS — Z888 Allergy status to other drugs, medicaments and biological substances status: Secondary | ICD-10-CM | POA: Diagnosis not present

## 2018-04-18 DIAGNOSIS — Z886 Allergy status to analgesic agent status: Secondary | ICD-10-CM | POA: Diagnosis not present

## 2018-04-18 DIAGNOSIS — Z880 Allergy status to penicillin: Secondary | ICD-10-CM | POA: Diagnosis not present

## 2018-04-18 DIAGNOSIS — Z91040 Latex allergy status: Secondary | ICD-10-CM | POA: Diagnosis not present

## 2018-04-18 DIAGNOSIS — Z882 Allergy status to sulfonamides status: Secondary | ICD-10-CM | POA: Diagnosis not present

## 2018-04-18 DIAGNOSIS — Z881 Allergy status to other antibiotic agents status: Secondary | ICD-10-CM | POA: Diagnosis not present

## 2018-04-26 DIAGNOSIS — R911 Solitary pulmonary nodule: Secondary | ICD-10-CM | POA: Diagnosis not present

## 2018-04-26 DIAGNOSIS — C499 Malignant neoplasm of connective and soft tissue, unspecified: Secondary | ICD-10-CM | POA: Diagnosis not present

## 2018-04-26 DIAGNOSIS — C493 Malignant neoplasm of connective and soft tissue of thorax: Secondary | ICD-10-CM | POA: Diagnosis not present

## 2018-04-28 ENCOUNTER — Ambulatory Visit: Payer: Self-pay | Admitting: *Deleted

## 2018-05-03 DIAGNOSIS — Z87891 Personal history of nicotine dependence: Secondary | ICD-10-CM | POA: Diagnosis not present

## 2018-05-03 DIAGNOSIS — Z9889 Other specified postprocedural states: Secondary | ICD-10-CM | POA: Diagnosis not present

## 2018-05-03 DIAGNOSIS — C493 Malignant neoplasm of connective and soft tissue of thorax: Secondary | ICD-10-CM | POA: Diagnosis not present

## 2018-05-03 DIAGNOSIS — Z853 Personal history of malignant neoplasm of breast: Secondary | ICD-10-CM | POA: Diagnosis not present

## 2018-05-03 DIAGNOSIS — Z79899 Other long term (current) drug therapy: Secondary | ICD-10-CM | POA: Diagnosis not present

## 2018-05-03 DIAGNOSIS — C7800 Secondary malignant neoplasm of unspecified lung: Secondary | ICD-10-CM | POA: Diagnosis not present

## 2018-05-03 DIAGNOSIS — Z923 Personal history of irradiation: Secondary | ICD-10-CM | POA: Diagnosis not present

## 2018-05-04 ENCOUNTER — Telehealth: Payer: Self-pay

## 2018-05-04 NOTE — Telephone Encounter (Signed)
Understandable. Thanks for letting us know.   Salvatore Marvel, MD [facility] [place] [facility] [place]

## 2018-05-04 NOTE — Telephone Encounter (Signed)
Patient is undergoing chemotherapy. Her chemo provider does not want her to continue allergy injections while she is on this treatment. Patient wanted me to let you know.

## 2018-05-05 NOTE — Telephone Encounter (Signed)
Patient has been informed. She will call before coming to get her injections.

## 2018-05-05 NOTE — Telephone Encounter (Signed)
I spoke with patient and she informed me that the chemotherapy provider recommended she receive her injection the 1 week out of the month that she is not receiveing her chemo treatments. She wanted to see what your thoughts are as far as that goes.

## 2018-05-05 NOTE — Telephone Encounter (Signed)
That sounds like a good plan.  Salvatore Marvel, MD [facility] [place] [facility] [place]

## 2018-05-06 ENCOUNTER — Ambulatory Visit: Payer: Self-pay

## 2018-05-10 DIAGNOSIS — R911 Solitary pulmonary nodule: Secondary | ICD-10-CM | POA: Diagnosis not present

## 2018-05-10 DIAGNOSIS — Z853 Personal history of malignant neoplasm of breast: Secondary | ICD-10-CM | POA: Diagnosis not present

## 2018-05-10 DIAGNOSIS — Z87891 Personal history of nicotine dependence: Secondary | ICD-10-CM | POA: Diagnosis not present

## 2018-05-10 DIAGNOSIS — C7800 Secondary malignant neoplasm of unspecified lung: Secondary | ICD-10-CM | POA: Diagnosis not present

## 2018-05-10 DIAGNOSIS — Z79899 Other long term (current) drug therapy: Secondary | ICD-10-CM | POA: Diagnosis not present

## 2018-05-10 DIAGNOSIS — C493 Malignant neoplasm of connective and soft tissue of thorax: Secondary | ICD-10-CM | POA: Diagnosis not present

## 2018-05-10 DIAGNOSIS — E669 Obesity, unspecified: Secondary | ICD-10-CM | POA: Diagnosis not present

## 2018-05-17 DIAGNOSIS — Z5111 Encounter for antineoplastic chemotherapy: Secondary | ICD-10-CM | POA: Diagnosis not present

## 2018-05-17 DIAGNOSIS — C7800 Secondary malignant neoplasm of unspecified lung: Secondary | ICD-10-CM | POA: Diagnosis not present

## 2018-05-17 DIAGNOSIS — C493 Malignant neoplasm of connective and soft tissue of thorax: Secondary | ICD-10-CM | POA: Diagnosis not present

## 2018-05-17 DIAGNOSIS — Z79899 Other long term (current) drug therapy: Secondary | ICD-10-CM | POA: Diagnosis not present

## 2018-05-24 DIAGNOSIS — R110 Nausea: Secondary | ICD-10-CM | POA: Diagnosis not present

## 2018-05-24 DIAGNOSIS — R911 Solitary pulmonary nodule: Secondary | ICD-10-CM | POA: Diagnosis not present

## 2018-05-24 DIAGNOSIS — C761 Malignant neoplasm of thorax: Secondary | ICD-10-CM | POA: Diagnosis not present

## 2018-05-24 DIAGNOSIS — Z79899 Other long term (current) drug therapy: Secondary | ICD-10-CM | POA: Diagnosis not present

## 2018-05-24 DIAGNOSIS — Z853 Personal history of malignant neoplasm of breast: Secondary | ICD-10-CM | POA: Diagnosis not present

## 2018-05-24 DIAGNOSIS — Z87891 Personal history of nicotine dependence: Secondary | ICD-10-CM | POA: Diagnosis not present

## 2018-05-24 DIAGNOSIS — C7800 Secondary malignant neoplasm of unspecified lung: Secondary | ICD-10-CM | POA: Diagnosis not present

## 2018-05-25 ENCOUNTER — Telehealth: Payer: Self-pay | Admitting: *Deleted

## 2018-05-25 NOTE — Telephone Encounter (Signed)
Called and left message for patient to call me back to discuss next appointment for Xolair injection that is scheduled for 05/27/18.  Per Dr. Ernst Bowler, patient can hold injection at this time due her current medical condition and having to undergo chemotherapy treatments.  Patient is also unable to drive and is having to get transportation.

## 2018-05-26 DIAGNOSIS — Z0001 Encounter for general adult medical examination with abnormal findings: Secondary | ICD-10-CM | POA: Diagnosis not present

## 2018-05-26 DIAGNOSIS — Z6841 Body Mass Index (BMI) 40.0 and over, adult: Secondary | ICD-10-CM | POA: Diagnosis not present

## 2018-05-26 DIAGNOSIS — I10 Essential (primary) hypertension: Secondary | ICD-10-CM | POA: Diagnosis not present

## 2018-05-26 DIAGNOSIS — Z1389 Encounter for screening for other disorder: Secondary | ICD-10-CM | POA: Diagnosis not present

## 2018-05-26 NOTE — Telephone Encounter (Signed)
Left message for patient to call office. Please see telephone note from 05/24/18.

## 2018-05-27 ENCOUNTER — Ambulatory Visit (INDEPENDENT_AMBULATORY_CARE_PROVIDER_SITE_OTHER): Payer: Medicare HMO | Admitting: *Deleted

## 2018-05-27 DIAGNOSIS — J4540 Moderate persistent asthma, uncomplicated: Secondary | ICD-10-CM

## 2018-05-27 NOTE — Telephone Encounter (Signed)
Patient came to office for Xolair injection.  Patient received injection today and per Dr. Ernst Bowler will hold injections going forward while paitent has chemotherapy and medications added for current cancer diagnosis and upcoming surgery.  Patient voiced understanding.

## 2018-05-31 DIAGNOSIS — C761 Malignant neoplasm of thorax: Secondary | ICD-10-CM | POA: Diagnosis not present

## 2018-05-31 DIAGNOSIS — R911 Solitary pulmonary nodule: Secondary | ICD-10-CM | POA: Diagnosis not present

## 2018-05-31 DIAGNOSIS — Z79899 Other long term (current) drug therapy: Secondary | ICD-10-CM | POA: Diagnosis not present

## 2018-05-31 DIAGNOSIS — R110 Nausea: Secondary | ICD-10-CM | POA: Diagnosis not present

## 2018-05-31 DIAGNOSIS — C7800 Secondary malignant neoplasm of unspecified lung: Secondary | ICD-10-CM | POA: Diagnosis not present

## 2018-05-31 DIAGNOSIS — Z87891 Personal history of nicotine dependence: Secondary | ICD-10-CM | POA: Diagnosis not present

## 2018-06-07 DIAGNOSIS — Z87891 Personal history of nicotine dependence: Secondary | ICD-10-CM | POA: Diagnosis not present

## 2018-06-07 DIAGNOSIS — Z5111 Encounter for antineoplastic chemotherapy: Secondary | ICD-10-CM | POA: Diagnosis not present

## 2018-06-07 DIAGNOSIS — R911 Solitary pulmonary nodule: Secondary | ICD-10-CM | POA: Diagnosis not present

## 2018-06-07 DIAGNOSIS — Z79899 Other long term (current) drug therapy: Secondary | ICD-10-CM | POA: Diagnosis not present

## 2018-06-07 DIAGNOSIS — C761 Malignant neoplasm of thorax: Secondary | ICD-10-CM | POA: Diagnosis not present

## 2018-06-14 DIAGNOSIS — Z87891 Personal history of nicotine dependence: Secondary | ICD-10-CM | POA: Diagnosis not present

## 2018-06-14 DIAGNOSIS — C493 Malignant neoplasm of connective and soft tissue of thorax: Secondary | ICD-10-CM | POA: Diagnosis not present

## 2018-06-14 DIAGNOSIS — R911 Solitary pulmonary nodule: Secondary | ICD-10-CM | POA: Diagnosis not present

## 2018-06-14 DIAGNOSIS — Z79899 Other long term (current) drug therapy: Secondary | ICD-10-CM | POA: Diagnosis not present

## 2018-06-14 DIAGNOSIS — C7800 Secondary malignant neoplasm of unspecified lung: Secondary | ICD-10-CM | POA: Diagnosis not present

## 2018-06-17 DIAGNOSIS — E7800 Pure hypercholesterolemia, unspecified: Secondary | ICD-10-CM | POA: Diagnosis not present

## 2018-06-17 DIAGNOSIS — I10 Essential (primary) hypertension: Secondary | ICD-10-CM | POA: Diagnosis not present

## 2018-06-22 ENCOUNTER — Other Ambulatory Visit: Payer: Self-pay | Admitting: Allergy & Immunology

## 2018-06-22 DIAGNOSIS — Z85118 Personal history of other malignant neoplasm of bronchus and lung: Secondary | ICD-10-CM | POA: Diagnosis not present

## 2018-06-22 DIAGNOSIS — C496 Malignant neoplasm of connective and soft tissue of trunk, unspecified: Secondary | ICD-10-CM | POA: Diagnosis not present

## 2018-06-22 DIAGNOSIS — Z79899 Other long term (current) drug therapy: Secondary | ICD-10-CM | POA: Diagnosis not present

## 2018-06-22 DIAGNOSIS — C493 Malignant neoplasm of connective and soft tissue of thorax: Secondary | ICD-10-CM | POA: Diagnosis not present

## 2018-06-28 DIAGNOSIS — C7800 Secondary malignant neoplasm of unspecified lung: Secondary | ICD-10-CM | POA: Diagnosis not present

## 2018-06-28 DIAGNOSIS — D492 Neoplasm of unspecified behavior of bone, soft tissue, and skin: Secondary | ICD-10-CM | POA: Diagnosis not present

## 2018-06-28 DIAGNOSIS — C499 Malignant neoplasm of connective and soft tissue, unspecified: Secondary | ICD-10-CM | POA: Diagnosis not present

## 2018-06-29 DIAGNOSIS — C7802 Secondary malignant neoplasm of left lung: Secondary | ICD-10-CM | POA: Diagnosis not present

## 2018-06-29 DIAGNOSIS — R918 Other nonspecific abnormal finding of lung field: Secondary | ICD-10-CM | POA: Diagnosis not present

## 2018-06-29 DIAGNOSIS — C499 Malignant neoplasm of connective and soft tissue, unspecified: Secondary | ICD-10-CM | POA: Diagnosis not present

## 2018-06-29 DIAGNOSIS — C7800 Secondary malignant neoplasm of unspecified lung: Secondary | ICD-10-CM | POA: Diagnosis not present

## 2018-06-29 DIAGNOSIS — M7989 Other specified soft tissue disorders: Secondary | ICD-10-CM | POA: Diagnosis not present

## 2018-07-05 DIAGNOSIS — Z5111 Encounter for antineoplastic chemotherapy: Secondary | ICD-10-CM | POA: Diagnosis not present

## 2018-07-05 DIAGNOSIS — C493 Malignant neoplasm of connective and soft tissue of thorax: Secondary | ICD-10-CM | POA: Diagnosis not present

## 2018-07-05 DIAGNOSIS — Z87891 Personal history of nicotine dependence: Secondary | ICD-10-CM | POA: Diagnosis not present

## 2018-07-05 DIAGNOSIS — Z79899 Other long term (current) drug therapy: Secondary | ICD-10-CM | POA: Diagnosis not present

## 2018-07-12 DIAGNOSIS — C499 Malignant neoplasm of connective and soft tissue, unspecified: Secondary | ICD-10-CM | POA: Diagnosis not present

## 2018-07-12 DIAGNOSIS — Z853 Personal history of malignant neoplasm of breast: Secondary | ICD-10-CM | POA: Diagnosis not present

## 2018-07-12 DIAGNOSIS — C7800 Secondary malignant neoplasm of unspecified lung: Secondary | ICD-10-CM | POA: Diagnosis not present

## 2018-07-12 DIAGNOSIS — D492 Neoplasm of unspecified behavior of bone, soft tissue, and skin: Secondary | ICD-10-CM | POA: Diagnosis not present

## 2018-07-12 DIAGNOSIS — Z79899 Other long term (current) drug therapy: Secondary | ICD-10-CM | POA: Diagnosis not present

## 2018-07-12 DIAGNOSIS — C493 Malignant neoplasm of connective and soft tissue of thorax: Secondary | ICD-10-CM | POA: Diagnosis not present

## 2018-07-13 DIAGNOSIS — Z7689 Persons encountering health services in other specified circumstances: Secondary | ICD-10-CM | POA: Diagnosis not present

## 2018-07-13 DIAGNOSIS — C493 Malignant neoplasm of connective and soft tissue of thorax: Secondary | ICD-10-CM | POA: Diagnosis not present

## 2018-07-18 DIAGNOSIS — R69 Illness, unspecified: Secondary | ICD-10-CM | POA: Diagnosis not present

## 2018-07-18 DIAGNOSIS — I10 Essential (primary) hypertension: Secondary | ICD-10-CM | POA: Diagnosis not present

## 2018-07-18 DIAGNOSIS — E782 Mixed hyperlipidemia: Secondary | ICD-10-CM | POA: Diagnosis not present

## 2018-07-26 DIAGNOSIS — C493 Malignant neoplasm of connective and soft tissue of thorax: Secondary | ICD-10-CM | POA: Diagnosis not present

## 2018-07-26 DIAGNOSIS — D492 Neoplasm of unspecified behavior of bone, soft tissue, and skin: Secondary | ICD-10-CM | POA: Diagnosis not present

## 2018-07-26 DIAGNOSIS — Z87891 Personal history of nicotine dependence: Secondary | ICD-10-CM | POA: Diagnosis not present

## 2018-07-26 DIAGNOSIS — Z79899 Other long term (current) drug therapy: Secondary | ICD-10-CM | POA: Diagnosis not present

## 2018-07-27 ENCOUNTER — Ambulatory Visit: Payer: Medicare HMO | Admitting: Allergy & Immunology

## 2018-08-02 DIAGNOSIS — Z5111 Encounter for antineoplastic chemotherapy: Secondary | ICD-10-CM | POA: Diagnosis not present

## 2018-08-02 DIAGNOSIS — Z87891 Personal history of nicotine dependence: Secondary | ICD-10-CM | POA: Diagnosis not present

## 2018-08-02 DIAGNOSIS — C493 Malignant neoplasm of connective and soft tissue of thorax: Secondary | ICD-10-CM | POA: Diagnosis not present

## 2018-08-03 ENCOUNTER — Ambulatory Visit: Payer: Medicare HMO | Admitting: Allergy & Immunology

## 2018-08-03 DIAGNOSIS — C493 Malignant neoplasm of connective and soft tissue of thorax: Secondary | ICD-10-CM | POA: Diagnosis not present

## 2018-08-11 ENCOUNTER — Ambulatory Visit (INDEPENDENT_AMBULATORY_CARE_PROVIDER_SITE_OTHER): Payer: Medicare HMO | Admitting: Allergy & Immunology

## 2018-08-11 ENCOUNTER — Encounter: Payer: Self-pay | Admitting: Allergy & Immunology

## 2018-08-11 ENCOUNTER — Other Ambulatory Visit: Payer: Self-pay

## 2018-08-11 DIAGNOSIS — Z7952 Long term (current) use of systemic steroids: Secondary | ICD-10-CM | POA: Diagnosis not present

## 2018-08-11 DIAGNOSIS — J4540 Moderate persistent asthma, uncomplicated: Secondary | ICD-10-CM | POA: Diagnosis not present

## 2018-08-11 DIAGNOSIS — T7802XD Anaphylactic reaction due to shellfish (crustaceans), subsequent encounter: Secondary | ICD-10-CM

## 2018-08-11 DIAGNOSIS — J300 Vasomotor rhinitis: Secondary | ICD-10-CM | POA: Diagnosis not present

## 2018-08-11 MED ORDER — EPINEPHRINE 0.3 MG/0.3ML IJ SOAJ: 0.3000 mg | Freq: Once | INTRAMUSCULAR | 1 refills | Status: AC

## 2018-08-11 NOTE — Patient Instructions (Addendum)
1. Chronic non-allergic rhinitis - Continue with current loratadine 10mg  daily. - Continue with Flonase one spray per nostril daily. - Continue with Astelin one spray per nostril daily.  2. Moderate persistent asthma, uncomplicated - We will have samples of Trelegy in the [place] office and you can pick them up tomorrow (April 24th). - I hope that the change to Trelegy will help manage her increased symptoms since stopping the Xolair.  - We will continue to hold the Xolair for now.  - Daily controller medication(s):Trelegy one inhalation once daily + prednisone 5mg  daily - Rescue medications: ProAir 4 puffs every 4-6 hours as needed, albuterol nebulizer one vial puffs every 4-6 hours as needed or DuoNeb nebulizer one vial every 4-6 hours as needed - Asthma control goals:  * Full participation in all desired activities (may need albuterol before activity) * Albuterol use two time or less a week on average (not counting use with activity) * Cough interfering with sleep two time or less a month * Oral steroids no more than once a year * No hospitalizations  3. Adverse food reaction (selected seafood) - EpiPen is up to date.  4. Return in about 6 months (around 02/10/2019). This can be an in-person, a virtual Webex or a telephone follow up visit.   Please inform us of any Emergency Department visits, hospitalizations, or changes in symptoms. Call us before going to the ED for breathing or allergy symptoms since we might be able to fit you in for a sick visit. Feel free to contact us anytime with any questions, problems, or concerns.  It was a pleasure to talk to you today today!  Websites that have reliable patient information: 1. American Academy of Asthma, Allergy, and Immunology: www.aaaai.org 2. Food Allergy Research and Education (FARE): foodallergy.org 3. Mothers of Asthmatics: http://www.asthmacommunitynetwork.org 4. American College of Allergy, Asthma, and Immunology:  www.acaai.org  "Like" [place] on Facebook and Instagram for our latest updates!      Make sure you are registered to vote! If you have moved or changed any of your contact information, you will need to get this updated before voting!    Voter ID laws are NOT going into effect for the General Election in November 2020! DO NOT let this stop you from exercising your right to vote!

## 2018-08-11 NOTE — Progress Notes (Signed)
RE: [name] [name] MRN: [id number] DOB: Sep 07, 1945 Date of Telemedicine Visit: 08/11/2018  Referring provider: Caryl Bis, MD Primary care provider: Caryl Bis, MD  Chief Complaint: Asthma (televisit at home. Patient gave verbal consent to treat and bill insurance for visit.)   Telemedicine Follow Up Visit via Telephone: I connected with [name] [name] for a follow up on 08/11/18 by telephone and verified that I am speaking with the correct person using two identifiers.   I discussed the limitations, risks, security and privacy concerns of performing an evaluation and management service by telephone and the availability of in person appointments. I also discussed with the patient that there may be a patient responsible charge related to this service. The patient expressed understanding and agreed to proceed.  Patient is at home accompanied by herself who provided/contributed to the history.  Provider is at the office.  Visit start time: 10:00 AM Visit end time: 10:25 AM Insurance consent/check in by: [facility] consent and medical assistant/nurse: Caryl Pina  History of Present Illness:  She is a 73 y.o. female, who is being followed for persistent asthma, NAR, and shellfish allergy (testing negative). Her previous allergy office visit was in October 2019 with Dr. Ernst Bowler.  At the last visit, she was doing very well.  We continued her on loratadine 10 mg daily as well as Flonase and Astelin as needed.  Her lung testing looked great.  We did not make any medications and continued her on Breo 200/25 mcg 1 puff daily in combination with prednisone 5 mg daily and Xolair monthly.  She had albuterol on hand to use as needed.  Since last visit, she has done well from an asthma perspective.  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 She is now being followed by Dr. Reva Bores  at [facility]. She is on gemcitabine and docetaxel as well as dexamethasone.  Apparently the plan is to treat with chemotherapy until surgical resection is more likely to be successful.  She has been quite tired from these injections.  She has had leukopenia from these as well.  She gets pegfilgrastim on the days following her chemo.  Asthma/Respiratory Symptom History: She remains on Breo 200/25 mcg 1 puff once daily.  She has been using her albuterol twice a day over the last several weeks.  This is higher than normal for her.  We did stop her Xolair because of her oncologic diagnosis.  She has not been on Spiriva in the past.  She is open to trying Trelegy.  She has not required any burst and her prednisone, which she is on for rheumatoid arthritis.  Allergic Rhinitis Symptom History: She remains on Claritin 10 mg daily.  She is not using the nose spray regularly.  She has been staying inside more often than not. She has not needed antibiotics since the last visit at all.   Food Allergy Symptom History: She continues to avoid shellfish. Her EpiPen is very out of date (January 2019), therefore she does need a new one.   Otherwise, there have been no changes to her past medical history, surgical history, family history, or social history.  Assessment and Plan:  [name] is a 73 y.o. 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 [name] unfortunately since last visit was diagnosed with angiosarcoma.  This necessitated her stopping her Xolair since she was on chemotherapy as well.  As a result of this, her asthma control has worsened.  To try to deal with this, we are going to change her from [facility] to Trelegy.  She is going to pick up some samples  tomorrow in the [place] office.  This will be an easy transition for her, as it is the same devices Breo.  We are also going to send in a new EpiPen since hers is over a year out of date.    1. Chronic non-allergic rhinitis - Continue with current loratadine 10mg  daily. - Continue with Flonase one spray per nostril daily. - Continue with Astelin one spray per nostril daily.  2. Moderate persistent asthma, uncomplicated - We will have samples of Trelegy in the [place] office and you can pick them up tomorrow (April 24th). - I hope that the change to Trelegy will help manage her increased symptoms since stopping the Xolair.  - We will continue to hold the Xolair for now.  - Daily controller medication(s):Trelegy one inhalation once daily + prednisone 5mg  daily - Rescue medications: ProAir 4 puffs every 4-6 hours as needed, albuterol nebulizer one vial puffs every 4-6 hours as needed or DuoNeb nebulizer one vial every 4-6 hours as needed - Asthma control goals:  * Full participation in all desired activities (may need albuterol before activity) * Albuterol use two time or less a week on average (not counting use with activity) * Cough interfering with sleep two time or less a month * Oral steroids no more than once a year * No hospitalizations  3. Adverse food reaction (selected seafood) - EpiPen is up to date.  4. Return in about 6 months (around 02/10/2019). This can be an in-person, a virtual Webex or a telephone follow up visit.   Diagnostics: None.  Medication List:  Current Outpatient Medications  Medication Sig Dispense Refill   albuterol (VENTOLIN HFA) 108 (90 Base) MCG/ACT inhaler      alendronate (FOSAMAX) 70 MG tablet Take 70 mg by mouth.     amLODipine (NORVASC) 10 MG tablet Take 10 mg by mouth.     aspirin 81 MG tablet Take 81 mg by mouth every morning.     azelastine (ASTELIN) 0.1 % nasal spray Place 1 spray into both nostrils daily. 30 mL 3   BREO  ELLIPTA 200-25 MCG/INH AEPB ONE PUFF INTO THE LUNGS DAILY. 60 each 1   celecoxib (CELEBREX) 200 MG capsule      dexamethasone (DECADRON) 4 MG tablet      diclofenac sodium (VOLTAREN) 1 % GEL Apply to affected area up to 4 times daily as needed for pain     escitalopram (LEXAPRO) 10 MG tablet Take 10 mg by mouth every morning.     fluticasone (FLONASE) 50 MCG/ACT nasal spray Place 1 spray into both nostrils daily. 16 g 3   furosemide (LASIX) 40 MG tablet Take by mouth.     ipratropium (ATROVENT HFA) 17 MCG/ACT inhaler Inhale into the lungs.     ipratropium-albuterol (DUONEB) 0.5-2.5 (3) MG/3ML SOLN      lidocaine-prilocaine (EMLA) cream      loratadine (CLARITIN) 10 MG tablet Take 10 mg by mouth.     losartan (COZAAR) 100 MG tablet      montelukast (SINGULAIR) 10 MG tablet  ondansetron (ZOFRAN) 4 MG tablet TAKE 1 TABLET BY MOUTH EVERY 8 HOURS AS NEEDED FOR UP TO 7 DAYS FOR NAUSEA AND VOMITING.     oxyCODONE (OXY IR/ROXICODONE) 5 MG immediate release tablet      pantoprazole (PROTONIX) 40 MG tablet      potassium chloride (KLOR-CON) 20 MEQ packet Take by mouth.     predniSONE (DELTASONE) 10 MG tablet      tiZANidine (ZANAFLEX) 2 MG tablet Take 1 to 2 tablets PO QHS     topiramate (TOPAMAX) 50 MG tablet      triamcinolone cream (KENALOG) 0.1 %      EPINEPHrine 0.3 mg/0.3 mL IJ SOAJ injection Inject 0.3 mLs (0.3 mg total) into the muscle once for 1 dose. 0.3 mL 1   Current Facility-Administered Medications  Medication Dose Route Frequency Provider Last Rate Last Dose   omalizumab Arvid Right) injection 300 mg  300 mg Subcutaneous Q28 days Valentina Shaggy, MD   300 mg at 05/27/18 1409   Allergies: Allergies  Allergen Reactions   Peanut Oil Shortness Of Breath   Penicillin G Anaphylaxis   Aspirin Nausea And Vomiting   Lactose Other (See Comments)   Naproxen Nausea And Vomiting   Tetracycline Nausea And Vomiting   Codeine Rash   Doxycycline Rash    Epinephrine Rash   Erythromycin Base Rash   Ibuprofen Rash   Latex Rash   Piroxicam Rash   Salicylates Itching and Rash   Sulfamethoxazole Rash   I reviewed her past medical history, social history, family history, and environmental history and no significant changes have been reported from previous visits.  Review of Systems  Constitutional: Negative for activity change and appetite change.  HENT: Negative for congestion, postnasal drip, rhinorrhea, sinus pressure and sore throat.   Eyes: Negative for pain, discharge, redness and itching.  Respiratory: Positive for shortness of breath. Negative for apnea, choking, chest tightness, wheezing and stridor.   Gastrointestinal: Negative for diarrhea, nausea and vomiting.  Musculoskeletal: Negative for arthralgias, joint swelling and myalgias.  Skin: Negative for rash.  Allergic/Immunologic: Negative for environmental allergies and food allergies.    Objective:  Physical exam not obtained as encounter was done via telephone.   Previous notes and tests were reviewed.  I discussed the assessment and treatment plan with the patient. The patient was provided an opportunity to ask questions and all were answered. The patient agreed with the plan and demonstrated an understanding of the instructions.   The patient was advised to call back or seek an in-person evaluation if the symptoms worsen or if the condition fails to improve as anticipated.  I provided 25 minutes of non-face-to-face time during this encounter.  It was my pleasure to participate in [place]'s care today. Please feel free to contact me with any questions or concerns.   Sincerely,  Valentina Shaggy, MD

## 2018-08-12 ENCOUNTER — Ambulatory Visit: Payer: Medicare HMO | Admitting: Allergy & Immunology

## 2018-08-16 DIAGNOSIS — Z5111 Encounter for antineoplastic chemotherapy: Secondary | ICD-10-CM | POA: Diagnosis not present

## 2018-08-16 DIAGNOSIS — C499 Malignant neoplasm of connective and soft tissue, unspecified: Secondary | ICD-10-CM | POA: Diagnosis not present

## 2018-08-16 DIAGNOSIS — D492 Neoplasm of unspecified behavior of bone, soft tissue, and skin: Secondary | ICD-10-CM | POA: Diagnosis not present

## 2018-08-16 DIAGNOSIS — C493 Malignant neoplasm of connective and soft tissue of thorax: Secondary | ICD-10-CM | POA: Diagnosis not present

## 2018-08-23 DIAGNOSIS — R2231 Localized swelling, mass and lump, right upper limb: Secondary | ICD-10-CM | POA: Diagnosis not present

## 2018-08-23 DIAGNOSIS — C493 Malignant neoplasm of connective and soft tissue of thorax: Secondary | ICD-10-CM | POA: Diagnosis not present

## 2018-08-23 DIAGNOSIS — R000 Tachycardia, unspecified: Secondary | ICD-10-CM | POA: Diagnosis not present

## 2018-08-23 DIAGNOSIS — Z20828 Contact with and (suspected) exposure to other viral communicable diseases: Secondary | ICD-10-CM | POA: Diagnosis not present

## 2018-08-23 DIAGNOSIS — C7802 Secondary malignant neoplasm of left lung: Secondary | ICD-10-CM | POA: Diagnosis not present

## 2018-08-23 DIAGNOSIS — I10 Essential (primary) hypertension: Secondary | ICD-10-CM | POA: Diagnosis not present

## 2018-08-23 DIAGNOSIS — D72829 Elevated white blood cell count, unspecified: Secondary | ICD-10-CM | POA: Diagnosis not present

## 2018-08-23 DIAGNOSIS — D638 Anemia in other chronic diseases classified elsewhere: Secondary | ICD-10-CM | POA: Diagnosis not present

## 2018-08-23 DIAGNOSIS — Z743 Need for continuous supervision: Secondary | ICD-10-CM | POA: Diagnosis not present

## 2018-08-23 DIAGNOSIS — N179 Acute kidney failure, unspecified: Secondary | ICD-10-CM | POA: Diagnosis not present

## 2018-08-23 DIAGNOSIS — J159 Unspecified bacterial pneumonia: Secondary | ICD-10-CM | POA: Diagnosis not present

## 2018-08-23 DIAGNOSIS — R918 Other nonspecific abnormal finding of lung field: Secondary | ICD-10-CM | POA: Diagnosis not present

## 2018-08-23 DIAGNOSIS — I491 Atrial premature depolarization: Secondary | ICD-10-CM | POA: Diagnosis not present

## 2018-08-23 DIAGNOSIS — R51 Headache: Secondary | ICD-10-CM | POA: Diagnosis not present

## 2018-08-23 DIAGNOSIS — I272 Pulmonary hypertension, unspecified: Secondary | ICD-10-CM | POA: Diagnosis not present

## 2018-08-23 DIAGNOSIS — R400 Somnolence: Secondary | ICD-10-CM | POA: Diagnosis not present

## 2018-08-23 DIAGNOSIS — R52 Pain, unspecified: Secondary | ICD-10-CM | POA: Diagnosis not present

## 2018-08-23 DIAGNOSIS — Z7189 Other specified counseling: Secondary | ICD-10-CM | POA: Diagnosis not present

## 2018-08-23 DIAGNOSIS — E8770 Fluid overload, unspecified: Secondary | ICD-10-CM | POA: Diagnosis not present

## 2018-08-23 DIAGNOSIS — K219 Gastro-esophageal reflux disease without esophagitis: Secondary | ICD-10-CM | POA: Diagnosis not present

## 2018-08-23 DIAGNOSIS — J9601 Acute respiratory failure with hypoxia: Secondary | ICD-10-CM | POA: Diagnosis not present

## 2018-08-23 DIAGNOSIS — Z66 Do not resuscitate: Secondary | ICD-10-CM | POA: Diagnosis not present

## 2018-08-23 DIAGNOSIS — D492 Neoplasm of unspecified behavior of bone, soft tissue, and skin: Secondary | ICD-10-CM | POA: Diagnosis not present

## 2018-08-23 DIAGNOSIS — J9811 Atelectasis: Secondary | ICD-10-CM | POA: Diagnosis not present

## 2018-08-23 DIAGNOSIS — R0902 Hypoxemia: Secondary | ICD-10-CM | POA: Diagnosis not present

## 2018-08-23 DIAGNOSIS — M7989 Other specified soft tissue disorders: Secondary | ICD-10-CM | POA: Diagnosis not present

## 2018-08-23 DIAGNOSIS — Z515 Encounter for palliative care: Secondary | ICD-10-CM | POA: Diagnosis not present

## 2018-08-23 DIAGNOSIS — R4182 Altered mental status, unspecified: Secondary | ICD-10-CM | POA: Diagnosis not present

## 2018-08-23 DIAGNOSIS — E875 Hyperkalemia: Secondary | ICD-10-CM | POA: Diagnosis not present

## 2018-08-23 DIAGNOSIS — D649 Anemia, unspecified: Secondary | ICD-10-CM | POA: Diagnosis not present

## 2018-08-23 DIAGNOSIS — J45998 Other asthma: Secondary | ICD-10-CM | POA: Diagnosis not present

## 2018-08-23 DIAGNOSIS — C761 Malignant neoplasm of thorax: Secondary | ICD-10-CM | POA: Diagnosis not present

## 2018-08-23 DIAGNOSIS — R930 Abnormal findings on diagnostic imaging of skull and head, not elsewhere classified: Secondary | ICD-10-CM | POA: Diagnosis not present

## 2018-08-23 DIAGNOSIS — I498 Other specified cardiac arrhythmias: Secondary | ICD-10-CM | POA: Diagnosis not present

## 2018-08-23 DIAGNOSIS — J690 Pneumonitis due to inhalation of food and vomit: Secondary | ICD-10-CM | POA: Diagnosis not present

## 2018-08-23 DIAGNOSIS — R404 Transient alteration of awareness: Secondary | ICD-10-CM | POA: Diagnosis not present

## 2018-08-23 DIAGNOSIS — R509 Fever, unspecified: Secondary | ICD-10-CM | POA: Diagnosis not present

## 2018-08-23 DIAGNOSIS — J4540 Moderate persistent asthma, uncomplicated: Secondary | ICD-10-CM | POA: Diagnosis not present

## 2018-08-23 DIAGNOSIS — R600 Localized edema: Secondary | ICD-10-CM | POA: Diagnosis not present

## 2018-08-23 DIAGNOSIS — N170 Acute kidney failure with tubular necrosis: Secondary | ICD-10-CM | POA: Diagnosis not present

## 2018-08-23 DIAGNOSIS — R531 Weakness: Secondary | ICD-10-CM | POA: Diagnosis not present

## 2018-08-23 DIAGNOSIS — M79601 Pain in right arm: Secondary | ICD-10-CM | POA: Diagnosis not present

## 2018-08-23 DIAGNOSIS — I493 Ventricular premature depolarization: Secondary | ICD-10-CM | POA: Diagnosis not present

## 2018-08-23 DIAGNOSIS — R58 Hemorrhage, not elsewhere classified: Secondary | ICD-10-CM | POA: Diagnosis not present

## 2018-08-23 DIAGNOSIS — G893 Neoplasm related pain (acute) (chronic): Secondary | ICD-10-CM | POA: Diagnosis not present

## 2018-08-23 DIAGNOSIS — A419 Sepsis, unspecified organism: Secondary | ICD-10-CM | POA: Diagnosis not present

## 2018-08-23 DIAGNOSIS — Z1389 Encounter for screening for other disorder: Secondary | ICD-10-CM | POA: Diagnosis not present

## 2018-08-23 DIAGNOSIS — R911 Solitary pulmonary nodule: Secondary | ICD-10-CM | POA: Diagnosis not present

## 2018-08-23 DIAGNOSIS — R0602 Shortness of breath: Secondary | ICD-10-CM | POA: Diagnosis not present

## 2018-08-23 DIAGNOSIS — S4991XA Unspecified injury of right shoulder and upper arm, initial encounter: Secondary | ICD-10-CM | POA: Diagnosis not present

## 2018-08-23 DIAGNOSIS — C7800 Secondary malignant neoplasm of unspecified lung: Secondary | ICD-10-CM | POA: Diagnosis not present

## 2018-08-23 DIAGNOSIS — G92 Toxic encephalopathy: Secondary | ICD-10-CM | POA: Diagnosis not present

## 2018-08-23 DIAGNOSIS — R279 Unspecified lack of coordination: Secondary | ICD-10-CM | POA: Diagnosis not present

## 2018-09-08 DIAGNOSIS — C7800 Secondary malignant neoplasm of unspecified lung: Secondary | ICD-10-CM | POA: Diagnosis not present

## 2018-09-17 DIAGNOSIS — I10 Essential (primary) hypertension: Secondary | ICD-10-CM | POA: Diagnosis not present

## 2018-09-17 DIAGNOSIS — E782 Mixed hyperlipidemia: Secondary | ICD-10-CM | POA: Diagnosis not present

## 2018-09-19 DEATH — deceased

## 2019-04-14 IMAGING — MR MR SHOULDER*R* W/O CM
4 of 5 series · 19 of 40 positions shown · non-contrast
Comparison: None.

CLINICAL DATA: Limited range of motion. Mass over the right
shoulder increasing in size.

EXAM:
MRI OF THE RIGHT SHOULDER WITHOUT CONTRAST
TECHNIQUE: Multiplanar, multisequence MR imaging of the shoulder was performed.
No intravenous contrast was administered.

[Series 5: PD fat-sat · axial · 4.0mm · 0.37mm/px · z∈[+24,+149]mm · 8 of 29 slices shown (1 of 2)]
[im 1/29]
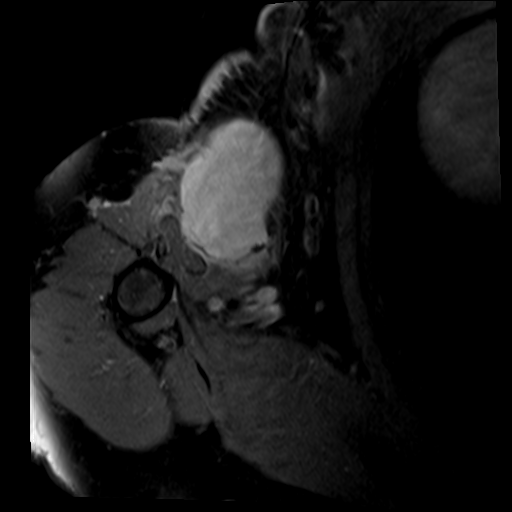
[im 5/29]
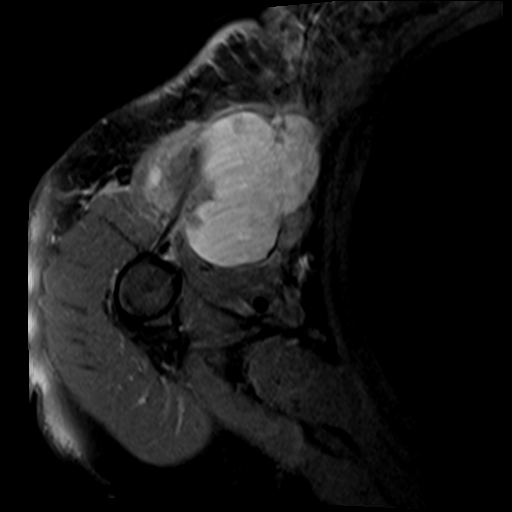
[im 9/29]
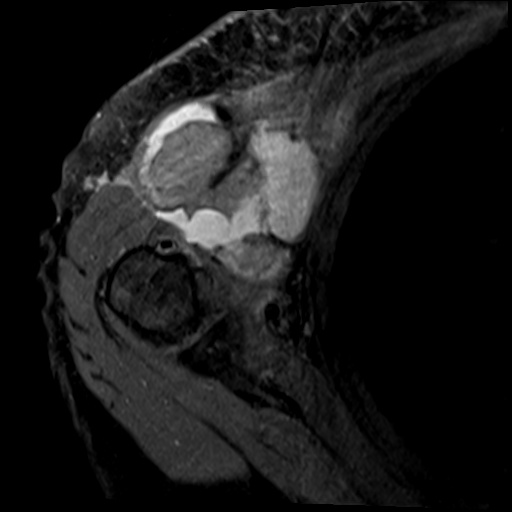
[im 13/29]
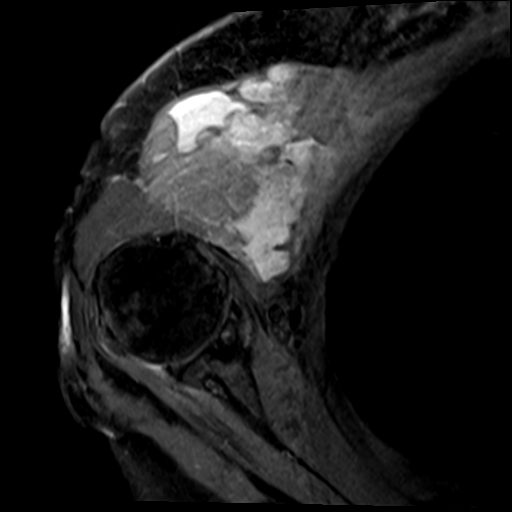
[im 17/29]
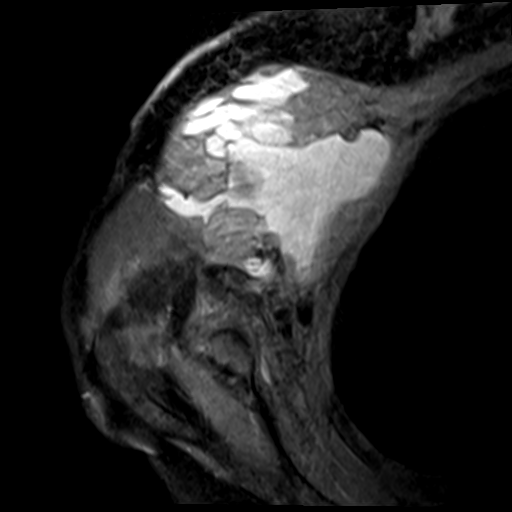
[im 21/29]
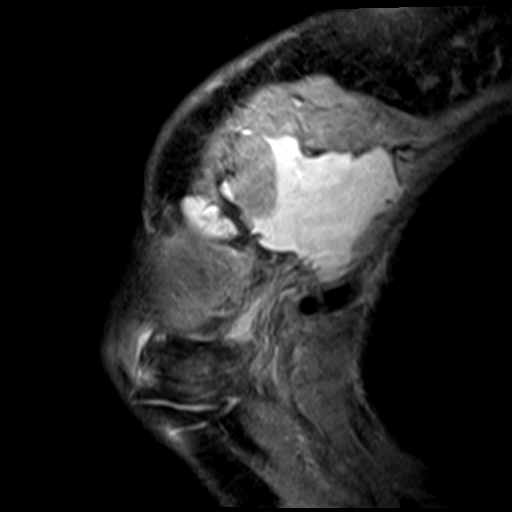
[im 25/29]
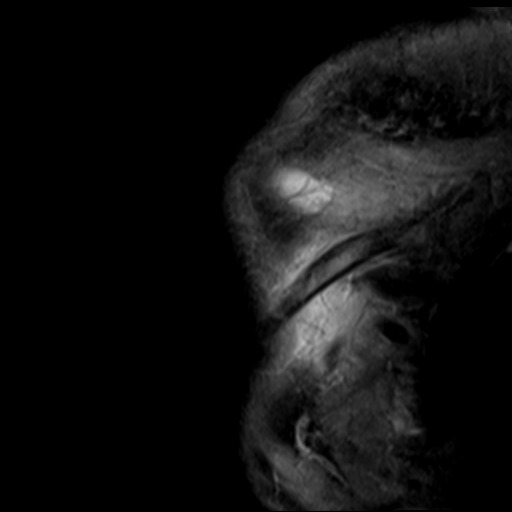
[im 29/29]
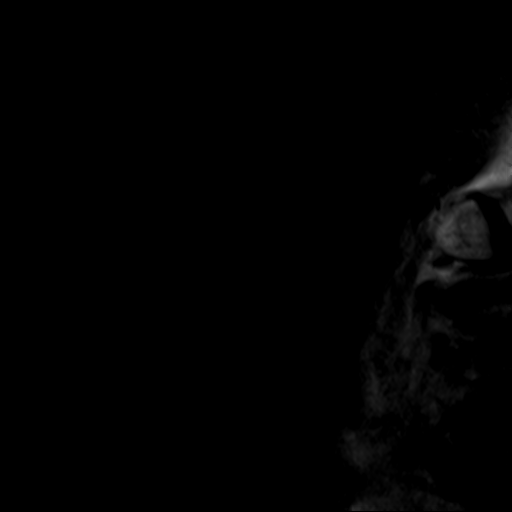

[Series 6: T2 fat-sat · coronal · 4.0mm · 0.37mm/px · 3 of 31 slices shown (1 of 2)]
[im 5/31]
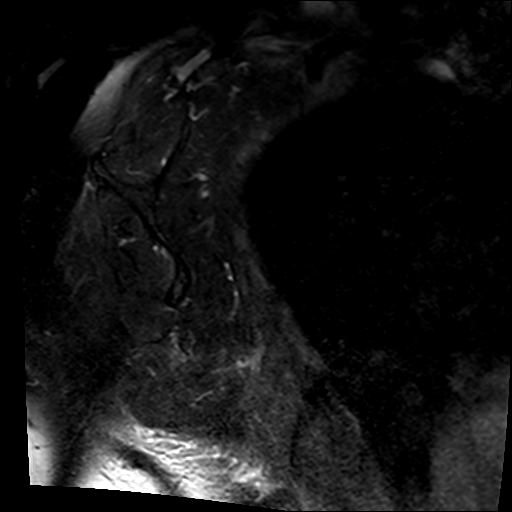
[im 18/31]
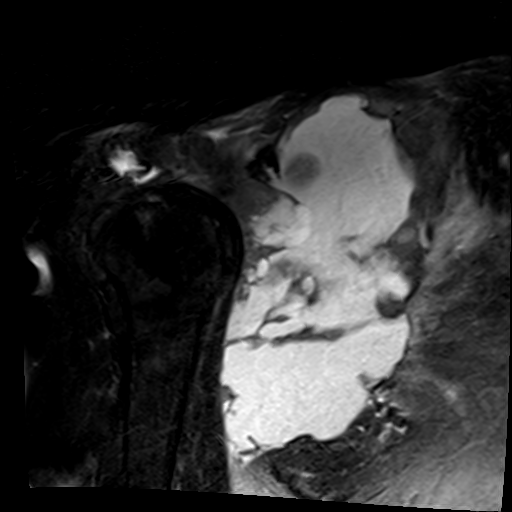
[im 26/31]
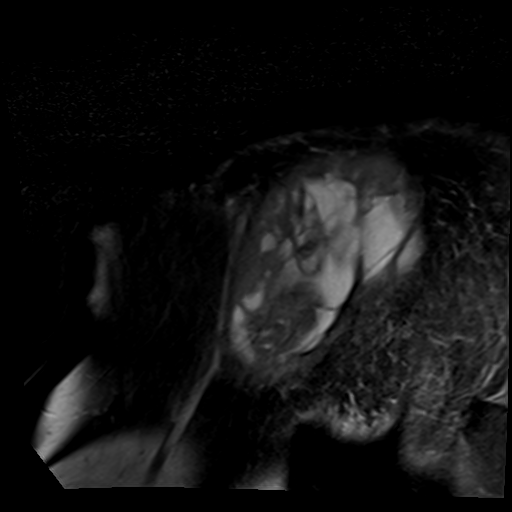

[Series 8: T2 fat-sat · oblique · 4.0mm · 0.37mm/px · 3 of 33 slices shown (2 of 2)]
[im 5/33]
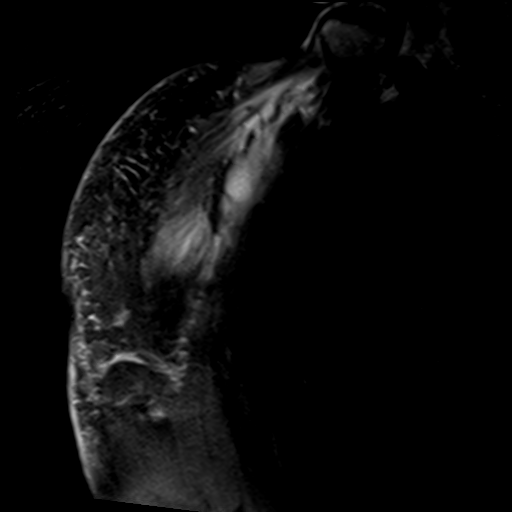
[im 19/33]
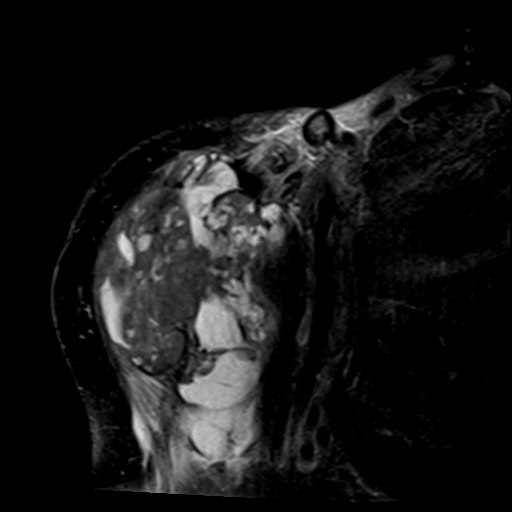
[im 28/33]
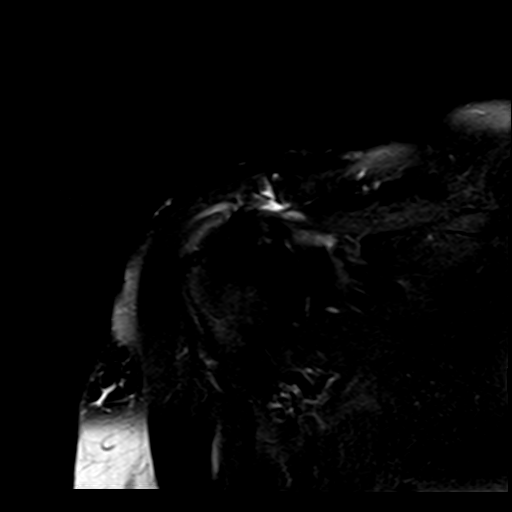

[Series 9: PD fat-sat · oblique · 4.0mm · 0.37mm/px · 5 of 33 slices shown (2 of 2)]
[im 1/33]
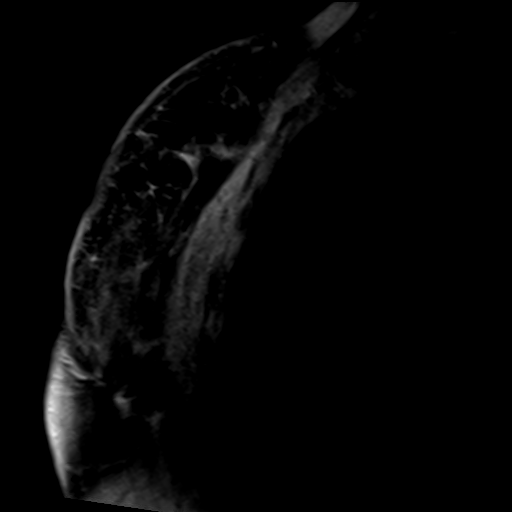
[im 5/33]
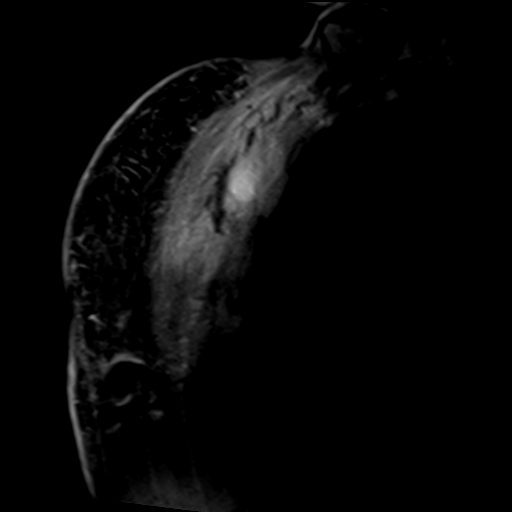
[im 10/33]
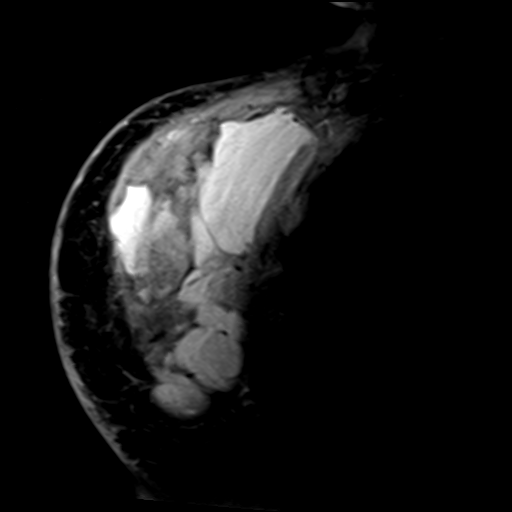
[im 19/33]
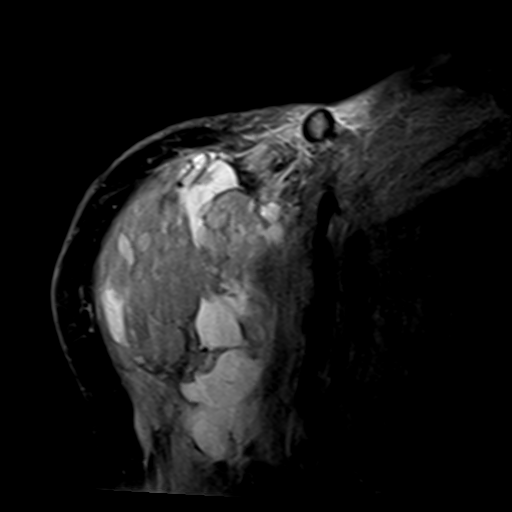
[im 28/33]
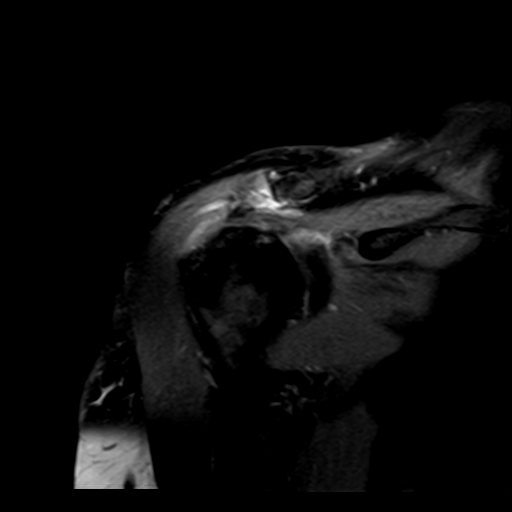

[19 of 40 positions shown; findings below may reference images not displayed]

FINDINGS: Rotator cuff: Moderate tendinosis of the supraspinatus tendon with a
partial-thickness bursal surface tear. Infraspinatus tendon is
intact. Teres minor tendon is intact. Subscapularis tendon is
intact.

Muscles: No atrophy or fatty replacement of nor abnormal signal
within, the muscles of the rotator cuff.

Biceps long head:  Intact.

Acromioclavicular Joint: Moderate arthropathy of the
acromioclavicular joint. Type I acromion. No subacromial/subdeltoid
bursal fluid.

Glenohumeral Joint: Small joint effusion with synovitis. Extensive
full-thickness cartilage loss of the glenohumeral joint.

Labrum:  Superior labral degeneration.

Bones:  No acute osseous abnormality.  No aggressive osseous lesion.

Other: 8.2 x 7.2 x 12.8 cm complex cystic mass with nodular
appearing areas with the largest measuring 3.5 x 4.9 cm. Mass
appears to be centered in the pectoralis muscle. Mass extends
superiorly into the infraclavicular fossa.
IMPRESSION: 1. 8.2 x 7.2 x 12.8 cm complex cystic mass along the anterior aspect
of shoulder which appears to be centered in the peripheral aspect of
pectoralis muscle. The mass is incompletely characterized but is
most concerning for a soft tissue sarcoma with hemorrhagic
components until proven otherwise. Recommend further evaluation with
a MRI of the right shoulder with intravenous contrast.
2. Moderate tendinosis of the supraspinatus tendon with a
partial-thickness bursal surface tear.
